# Patient Record
Sex: Male | Born: 1935 | Race: White | Hispanic: No | Marital: Married | Smoking: Never smoker
Health system: Southern US, Community
[De-identification: ages and names within clinical notes are randomized; demographics above are authoritative.]

## PROBLEM LIST (undated history)

## (undated) DIAGNOSIS — E079 Disorder of thyroid, unspecified: Secondary | ICD-10-CM

## (undated) DIAGNOSIS — I4891 Unspecified atrial fibrillation: Secondary | ICD-10-CM

## (undated) DIAGNOSIS — I2510 Atherosclerotic heart disease of native coronary artery without angina pectoris: Secondary | ICD-10-CM

## (undated) DIAGNOSIS — I10 Essential (primary) hypertension: Secondary | ICD-10-CM

## (undated) DIAGNOSIS — G4730 Sleep apnea, unspecified: Secondary | ICD-10-CM

## (undated) HISTORY — PX: PACEMAKER IMPLANT: EP1218

## (undated) HISTORY — PX: HERNIA REPAIR: SHX51

## (undated) HISTORY — PX: BACK SURGERY: SHX140

## (undated) HISTORY — PX: PROSTATE ABLATION: SHX6042

---

## 2010-01-06 ENCOUNTER — Ambulatory Visit: Payer: Self-pay | Admitting: Ophthalmology

## 2010-01-12 ENCOUNTER — Ambulatory Visit: Payer: Self-pay | Admitting: Cardiovascular Disease

## 2010-01-12 ENCOUNTER — Ambulatory Visit: Payer: Self-pay | Admitting: Ophthalmology

## 2011-09-07 ENCOUNTER — Ambulatory Visit: Payer: Self-pay | Admitting: Anesthesiology

## 2011-10-14 ENCOUNTER — Ambulatory Visit: Payer: Self-pay | Admitting: Anesthesiology

## 2016-02-11 DIAGNOSIS — E039 Hypothyroidism, unspecified: Secondary | ICD-10-CM | POA: Diagnosis present

## 2016-03-23 DIAGNOSIS — Z950 Presence of cardiac pacemaker: Secondary | ICD-10-CM | POA: Insufficient documentation

## 2017-09-17 ENCOUNTER — Other Ambulatory Visit: Payer: Self-pay

## 2017-09-17 ENCOUNTER — Emergency Department: Payer: Medicare Other

## 2017-09-17 ENCOUNTER — Emergency Department
Admission: EM | Admit: 2017-09-17 | Discharge: 2017-09-17 | Disposition: A | Discharge status: Home / Self Care | Payer: Medicare Other | Attending: Emergency Medicine | Admitting: Emergency Medicine

## 2017-09-17 DIAGNOSIS — R600 Localized edema: Secondary | ICD-10-CM

## 2017-09-17 DIAGNOSIS — R609 Edema, unspecified: Secondary | ICD-10-CM | POA: Diagnosis not present

## 2017-09-17 DIAGNOSIS — I10 Essential (primary) hypertension: Secondary | ICD-10-CM | POA: Diagnosis not present

## 2017-09-17 DIAGNOSIS — J90 Pleural effusion, not elsewhere classified: Secondary | ICD-10-CM | POA: Diagnosis not present

## 2017-09-17 DIAGNOSIS — Z950 Presence of cardiac pacemaker: Secondary | ICD-10-CM | POA: Diagnosis not present

## 2017-09-17 DIAGNOSIS — I2510 Atherosclerotic heart disease of native coronary artery without angina pectoris: Secondary | ICD-10-CM | POA: Diagnosis not present

## 2017-09-17 DIAGNOSIS — R0602 Shortness of breath: Secondary | ICD-10-CM | POA: Diagnosis present

## 2017-09-17 HISTORY — DX: Atherosclerotic heart disease of native coronary artery without angina pectoris: I25.10

## 2017-09-17 HISTORY — DX: Essential (primary) hypertension: I10

## 2017-09-17 HISTORY — DX: Unspecified atrial fibrillation: I48.91

## 2017-09-17 HISTORY — DX: Sleep apnea, unspecified: G47.30

## 2017-09-17 HISTORY — DX: Disorder of thyroid, unspecified: E07.9

## 2017-09-17 LAB — CBC
HCT: 37.1 % — ABNORMAL LOW (ref 40.0–52.0)
HEMOGLOBIN: 12.6 g/dL — AB (ref 13.0–18.0)
MCH: 36.5 pg — ABNORMAL HIGH (ref 26.0–34.0)
MCHC: 34 g/dL (ref 32.0–36.0)
MCV: 107.4 fL — ABNORMAL HIGH (ref 80.0–100.0)
Platelets: 186 10*3/uL (ref 150–440)
RBC: 3.46 MIL/uL — AB (ref 4.40–5.90)
RDW: 16.6 % — ABNORMAL HIGH (ref 11.5–14.5)
WBC: 4.8 10*3/uL (ref 3.8–10.6)

## 2017-09-17 LAB — COMPREHENSIVE METABOLIC PANEL
ALT: 17 U/L (ref 17–63)
AST: 29 U/L (ref 15–41)
Albumin: 3.7 g/dL (ref 3.5–5.0)
Alkaline Phosphatase: 64 U/L (ref 38–126)
Anion gap: 7 (ref 5–15)
BUN: 24 mg/dL — AB (ref 6–20)
CHLORIDE: 102 mmol/L (ref 101–111)
CO2: 27 mmol/L (ref 22–32)
CREATININE: 1.38 mg/dL — AB (ref 0.61–1.24)
Calcium: 8.8 mg/dL — ABNORMAL LOW (ref 8.9–10.3)
GFR calc Af Amer: 53 mL/min — ABNORMAL LOW (ref >60)
GFR calc non Af Amer: 46 mL/min — ABNORMAL LOW (ref >60)
GLUCOSE: 119 mg/dL — AB (ref 65–99)
Potassium: 4.1 mmol/L (ref 3.5–5.1)
SODIUM: 136 mmol/L (ref 135–145)
Total Bilirubin: 1.5 mg/dL — ABNORMAL HIGH (ref 0.3–1.2)
Total Protein: 6.4 g/dL — ABNORMAL LOW (ref 6.5–8.1)

## 2017-09-17 LAB — TROPONIN I
TROPONIN I: 0.05 ng/mL — AB (ref <0.03)
TROPONIN I: 0.05 ng/mL — AB (ref <0.03)

## 2017-09-17 LAB — BRAIN NATRIURETIC PEPTIDE: B Natriuretic Peptide: 597 pg/mL — ABNORMAL HIGH (ref 0.0–100.0)

## 2017-09-17 MED ORDER — IPRATROPIUM-ALBUTEROL 0.5-2.5 (3) MG/3ML IN SOLN
3.0000 mL | Freq: Once | RESPIRATORY_TRACT | Status: AC
  Administered 2017-09-17: 3 mL via RESPIRATORY_TRACT
  Filled 2017-09-17: qty 3

## 2017-09-17 MED ORDER — FUROSEMIDE 10 MG/ML IJ SOLN
20.0000 mg | Freq: Once | INTRAMUSCULAR | Status: AC
  Administered 2017-09-17: 20 mg via INTRAVENOUS
  Filled 2017-09-17: qty 4

## 2017-09-17 NOTE — Discharge Instructions (Signed)
Please seek medical attention for any high fevers, chest pain, shortness of breath, change in behavior, persistent vomiting, bloody stool or any other new or concerning symptoms.  

## 2017-09-17 NOTE — ED Triage Notes (Signed)
Pt c/o increased SOB for the past 3 days with HA and nausea today. Pt is slightly tachypneic in triage.

## 2017-09-17 NOTE — ED Provider Notes (Signed)
[facility] Emergency Department Provider Note   ____________________________________________   I have reviewed the triage vital signs and the nursing notes.   HISTORY  Chief Complaint Shortness of Breath   History limited by: Not Limited   HPI Jacob Glenn is a 83 y.o. male who presents to the emergency department today because of shortness of breath  DURATION:roughly 1 week TIMING: gradually worsening CONTEXT: patient states he has history of COPD.  MODIFYING FACTORS: worse with lying flat ASSOCIATED SYMPTOMS: denies any chest pain. Denies any fever. Has some nausea.   Per medical record review patient has a history of Afib, CAD.   Past Medical History:  Diagnosis Date  . A-fib ([place])   . Coronary artery disease   . Hypertension   . Sleep apnea   . Thyroid disease     There are no active problems to display for this patient.   Past Surgical History:  Procedure Laterality Date  . BACK SURGERY    . HERNIA REPAIR    . PACEMAKER IMPLANT    . PROSTATE ABLATION      Prior to Admission medications   Not on File    Allergies Patient has no known allergies.  No family history on file.  Social History Social History   Tobacco Use  . Smoking status: Never Smoker  . Smokeless tobacco: Never Used  Substance Use Topics  . Alcohol use: No    Frequency: Never  . Drug use: No    Review of Systems Constitutional: No fever/chills Eyes: No visual changes. ENT: No sore throat. Cardiovascular: Denies chest pain. Respiratory: Positive for shortness of breath. Gastrointestinal: No abdominal pain.  No nausea, no vomiting.  No diarrhea.   Genitourinary: Negative for dysuria. Musculoskeletal: Negative for back pain. Skin: Negative for rash. Neurological: Positive for headache.   ____________________________________________   PHYSICAL EXAM:  VITAL SIGNS: ED Triage Vitals [09/17/17 1453]  Enc Vitals Group     BP (!) 153/71   Pulse Rate 70     Resp (!) 21     Temp      Temp Source Oral     SpO2 100 %     Weight 180 lb (81.6 kg)     Height 6' (1.829 m)     Head Circumference      Peak Flow      Pain Score 5   Constitutional: Alert and oriented. Well appearing and in no distress. Eyes: Conjunctivae are normal.  ENT   Head: Normocephalic and atraumatic.   Nose: No congestion/rhinnorhea.   Mouth/Throat: Mucous membranes are moist.   Neck: No stridor. Hematological/Lymphatic/Immunilogical: No cervical lymphadenopathy. Cardiovascular: Normal rate, regular rhythm.  No murmurs, rubs, or gallops. Respiratory: Normal respiratory effort without tachypnea nor retractions. Slight wheeze in bases.  Gastrointestinal: Soft and non tender. No rebound. No guarding.  Genitourinary: Deferred Musculoskeletal: Normal range of motion in all extremities. Trace bilateral pitting edema.  Neurologic:  Normal speech and language. No gross focal neurologic deficits are appreciated.  Skin:  Skin is warm, dry and intact. No rash noted. Psychiatric: Mood and affect are normal. Speech and behavior are normal. Patient exhibits appropriate insight and judgment.  ____________________________________________    LABS (pertinent positives/negatives)  Trop 0.05 x 2 BNP 597.0 CBC wbc 4.8, hgb 12.6 CMP cr 1.38, k 4.1  ____________________________________________   EKG  I, Nance Pear, attending physician, personally viewed and interpreted this EKG  EKG Time: 1455 Rate: 70 Rhythm: ventricular paced rhythm Axis: left  axis deviation Intervals: qtc 488 QRS: wide ST changes: no st elevation equivalent Impression: abnormal ekg   ____________________________________________    RADIOLOGY  CXR Small pleural effusions with infiltrate.  ____________________________________________   PROCEDURES  Procedures  ____________________________________________   INITIAL IMPRESSION / ASSESSMENT AND PLAN / ED  COURSE  Pertinent labs & imaging results that were available during my care of the patient were reviewed by me and considered in my medical decision making (see chart for details).  Differential includes, but is not limited to, viral syndrome, bronchitis including COPD exacerbation, pneumonia, reactive airway disease including asthma, CHF including exacerbation with or without pulmonary/interstitial edema, pneumothorax, ACS, thoracic trauma, and pulmonary embolism. Patient did have some peripheral edema on exam. BNP elevated. Do think likely sob secondary to fluid overload. Patient had been on lasix on the past but currently not taking any. Troponin was initially elevated but repeat does not show any change. Doubt ACS, think elevation more likely secondary to fluid overload. No chest pain. Will give patient dose of lasix here in the emergency department. Discussed findings with patient. Discussed importance of primary care follow up.  ____________________________________________   FINAL CLINICAL IMPRESSION(S) / ED DIAGNOSES  Final diagnoses:  SOB (shortness of breath)  Pleural effusion  Peripheral edema     Note: This dictation was prepared with Dragon dictation. Any transcriptional errors that result from this process are unintentional     Nance Pear, MD 09/17/17 [phone]

## 2017-09-17 NOTE — ED Notes (Signed)
Date and time results received: 09/17/17 4:53 PM  Test: troponin I Critical Value: 0.05  Name of Provider Notified: Dr. Archie Balboa  Orders Received? Or Actions Taken?: no new orders at this time

## 2017-09-17 NOTE — ED Notes (Signed)

## 2019-03-29 DIAGNOSIS — Z66 Do not resuscitate: Secondary | ICD-10-CM | POA: Insufficient documentation

## 2019-04-08 ENCOUNTER — Emergency Department: Payer: Medicare Other

## 2019-04-08 ENCOUNTER — Other Ambulatory Visit: Payer: Self-pay

## 2019-04-08 ENCOUNTER — Emergency Department
Admission: EM | Admit: 2019-04-08 | Discharge: 2019-04-08 | Disposition: A | Discharge status: Home / Self Care | Payer: Medicare Other | Attending: Emergency Medicine | Admitting: Emergency Medicine

## 2019-04-08 ENCOUNTER — Encounter: Payer: Self-pay | Admitting: Emergency Medicine

## 2019-04-08 DIAGNOSIS — I2510 Atherosclerotic heart disease of native coronary artery without angina pectoris: Secondary | ICD-10-CM | POA: Diagnosis not present

## 2019-04-08 DIAGNOSIS — Z79899 Other long term (current) drug therapy: Secondary | ICD-10-CM | POA: Diagnosis not present

## 2019-04-08 DIAGNOSIS — Z7901 Long term (current) use of anticoagulants: Secondary | ICD-10-CM | POA: Insufficient documentation

## 2019-04-08 DIAGNOSIS — Z950 Presence of cardiac pacemaker: Secondary | ICD-10-CM | POA: Insufficient documentation

## 2019-04-08 DIAGNOSIS — N189 Chronic kidney disease, unspecified: Secondary | ICD-10-CM | POA: Diagnosis not present

## 2019-04-08 DIAGNOSIS — R0789 Other chest pain: Secondary | ICD-10-CM | POA: Insufficient documentation

## 2019-04-08 DIAGNOSIS — I129 Hypertensive chronic kidney disease with stage 1 through stage 4 chronic kidney disease, or unspecified chronic kidney disease: Secondary | ICD-10-CM | POA: Insufficient documentation

## 2019-04-08 DIAGNOSIS — R079 Chest pain, unspecified: Secondary | ICD-10-CM | POA: Diagnosis present

## 2019-04-08 LAB — CBC
HCT: 37.8 % — ABNORMAL LOW (ref 39.0–52.0)
Hemoglobin: 12.7 g/dL — ABNORMAL LOW (ref 13.0–17.0)
MCH: 35.3 pg — ABNORMAL HIGH (ref 26.0–34.0)
MCHC: 33.6 g/dL (ref 30.0–36.0)
MCV: 105 fL — ABNORMAL HIGH (ref 80.0–100.0)
Platelets: 170 10*3/uL (ref 150–400)
RBC: 3.6 MIL/uL — ABNORMAL LOW (ref 4.22–5.81)
RDW: 14.7 % (ref 11.5–15.5)
WBC: 7.8 10*3/uL (ref 4.0–10.5)
nRBC: 0 % (ref 0.0–0.2)

## 2019-04-08 LAB — BASIC METABOLIC PANEL
Anion gap: 8 (ref 5–15)
BUN: 34 mg/dL — ABNORMAL HIGH (ref 8–23)
CO2: 28 mmol/L (ref 22–32)
Calcium: 8.5 mg/dL — ABNORMAL LOW (ref 8.9–10.3)
Chloride: 102 mmol/L (ref 98–111)
Creatinine, Ser: 2.05 mg/dL — ABNORMAL HIGH (ref 0.61–1.24)
GFR calc Af Amer: 33 mL/min — ABNORMAL LOW (ref >60)
GFR calc non Af Amer: 29 mL/min — ABNORMAL LOW (ref >60)
Glucose, Bld: 110 mg/dL — ABNORMAL HIGH (ref 70–99)
Potassium: 4 mmol/L (ref 3.5–5.1)
Sodium: 138 mmol/L (ref 135–145)

## 2019-04-08 LAB — TROPONIN I
Troponin I: 0.05 ng/mL (ref <0.03)
Troponin I: 0.05 ng/mL (ref <0.03)

## 2019-04-08 MED ORDER — ASPIRIN 81 MG PO CHEW
162.0000 mg | CHEWABLE_TABLET | Freq: Once | ORAL | Status: AC
  Administered 2019-04-08: 11:00:00 162 mg via ORAL
  Filled 2019-04-08: qty 2

## 2019-04-08 MED ORDER — SODIUM CHLORIDE 0.9% FLUSH
3.0000 mL | Freq: Once | INTRAVENOUS | Status: AC
  Administered 2019-04-08: 3 mL via INTRAVENOUS

## 2019-04-08 NOTE — Discharge Instructions (Addendum)
Your kidney function has worsened somewhat, with a creatinine level of 2 (previously it was 1.6).  You should follow up with your regular doctor within the next 1-2 weeks to have this rechecked.  Return to the ER immediately for new, worsening, or persistent chest pain, difficulty breathing, weakness or lightheadedness, or any other new or worsening symptoms that concern you.

## 2019-04-08 NOTE — ED Provider Notes (Signed)
[facility] Emergency Department Provider Note ____________________________________________   First MD Initiated Contact with Patient 04/08/19 1043     (approximate)  I have reviewed the triage vital signs and the nursing notes.   HISTORY  Chief Complaint Chest Pain    HPI Jacob Glenn is a 85 y.o. male with PMH as noted below including atrial fibrillation on Eliquis and CAD who presents with chest pain, acute onset around 3 AM and described as a severe substernal pain which did not radiate.  The patient states that it was worse with deep inspiration.  It lasted for several hours and then gradually abated.  He states he just feels a mild discomfort when he takes a deep breath now.  He states during the intense pain he felt somewhat short of breath although he does not feel this way anymore.  He denies nausea or vomiting and has no dizziness.  He denies any cough or fever and states he was feeling fine until early this morning.  Past Medical History:  Diagnosis Date  . A-fib ([place])   . Coronary artery disease   . Hypertension   . Sleep apnea   . Thyroid disease     There are no active problems to display for this patient.   Past Surgical History:  Procedure Laterality Date  . BACK SURGERY    . HERNIA REPAIR    . PACEMAKER IMPLANT    . PROSTATE ABLATION      Prior to Admission medications   Medication Sig Start Date End Date Taking? Authorizing Provider  apixaban (ELIQUIS) 2.5 MG TABS tablet Take 2.5 mg by mouth 2 (two) times a day. 10/17/18  Yes [provider]  carvedilol (COREG) 3.125 MG tablet Take 3.125 mg by mouth 2 (two) times a day. 10/17/18  Yes [provider]  furosemide (LASIX) 40 MG tablet Take 40 mg by mouth daily. 11/20/18  Yes [provider]  levothyroxine (SYNTHROID) 100 MCG tablet Take 100 mcg by mouth daily. 07/03/18  Yes [provider]  losartan (COZAAR) 25 MG tablet Take 25 mg by mouth daily.  11/20/18 11/20/19 Yes [provider]    Allergies Patient has no known allergies.  No family history on file.  Social History Social History   Tobacco Use  . Smoking status: Never Smoker  . Smokeless tobacco: Never Used  Substance Use Topics  . Alcohol use: No    Frequency: Never  . Drug use: No    Review of Systems  Constitutional: No fever. Eyes: No redness. ENT: No neck pain. Cardiovascular: Positive for resolved chest pain. Respiratory: Positive for resolved shortness of breath. Gastrointestinal: No vomiting or diarrhea.  Genitourinary: Negative for flank pain.  Musculoskeletal: Negative for back pain.  Negative for leg swelling. Skin: Negative for rash. Neurological: Negative for headache.   ____________________________________________   PHYSICAL EXAM:  VITAL SIGNS: ED Triage Vitals  Enc Vitals Group     BP 04/08/19 1043 114/79     Pulse Rate 04/08/19 1043 70     Resp 04/08/19 1043 20     Temp 04/08/19 1043 98.5 F (36.9 C)     Temp Source 04/08/19 1043 Oral     SpO2 04/08/19 1043 99 %     Weight 04/08/19 1044 180 lb (81.6 kg)     Height 04/08/19 1044 6' (1.829 m)     Head Circumference --      Peak Flow --      Pain Score  04/08/19 1044 0     Pain Loc --      Pain Edu? --      Excl. in [place]? --     Constitutional: Alert and oriented. Well appearing for age and in no acute distress. Eyes: Conjunctivae are normal.  Head: Atraumatic. Nose: No congestion/rhinnorhea. Mouth/Throat: Mucous membranes are moist.   Neck: Normal range of motion.  Cardiovascular: Normal rate, regular rhythm. Grossly normal heart sounds.  Good peripheral circulation. Respiratory: Normal respiratory effort.  No retractions. Lungs CTAB. Gastrointestinal: No distention.  Musculoskeletal: No lower extremity edema.  No calf or popliteal swelling or tenderness.  Extremities warm and well perfused.  Neurologic:  Normal speech and language. No gross focal neurologic deficits  are appreciated.  Skin:  Skin is warm and dry. No rash noted. Psychiatric: Mood and affect are normal. Speech and behavior are normal.  ____________________________________________   LABS (all labs ordered are listed, but only abnormal results are displayed)  Labs Reviewed  BASIC METABOLIC PANEL - Abnormal; Notable for the following components:      Result Value   Glucose, Bld 110 (*)    BUN 34 (*)    Creatinine, Ser 2.05 (*)    Calcium 8.5 (*)    GFR calc non Af Amer 29 (*)    GFR calc Af Amer 33 (*)    All other components within normal limits  CBC - Abnormal; Notable for the following components:   RBC 3.60 (*)    Hemoglobin 12.7 (*)    HCT 37.8 (*)    MCV 105.0 (*)    MCH 35.3 (*)    All other components within normal limits  TROPONIN I - Abnormal; Notable for the following components:   Troponin I 0.05 (*)    All other components within normal limits  TROPONIN I - Abnormal; Notable for the following components:   Troponin I 0.05 (*)    All other components within normal limits   ____________________________________________  EKG  ED ECG REPORT I, Arta Silence, the attending physician, personally viewed and interpreted this ECG.  Date: 04/08/2019 EKG Time: 1039 Rate: 70 Rhythm: Paced rhythm QRS Axis: normal Intervals: LBBB morphology ST/T Wave abnormalities: normal Narrative Interpretation: no evidence of acute ischemia  ____________________________________________  RADIOLOGY  CXR: No focal infiltrate or other acute abnormality  ____________________________________________   PROCEDURES  Procedure(s) performed: No  Procedures  Critical Care performed: No ____________________________________________   INITIAL IMPRESSION / ASSESSMENT AND PLAN / ED COURSE  Pertinent labs & imaging results that were available during my care of the patient were reviewed by me and considered in my medical decision making (see chart for details).  85 year old  male with a history of atrial fibrillation, hypertension, pacemaker, and CAD presents with chest pain starting around 4 AM and now almost completely resolved.  EMS sent over an EKG which showed approximately 5 mm of discordant ST elevation in lead V4 only, and reported that the patient had no active chest pain so I did not activate STEMI code.  On exam the patient is relatively well-appearing for his age.  His vital signs are normal.  The physical exam is unremarkable.  EKG here shows paced rhythm with LBBB morphology and I no longer see the discordant elevation in V4.  The current EKG appears identical to the patient's most recent EKG in November 2018.  Overall I have a low suspicion for cardiac etiology on this presentation although the patient's risk is somewhat elevated given his history.  Although the patient reports that the pain was somewhat pleuritic, given that it resolved spontaneously and since he has no leg swelling or other symptoms of DVT and no tachycardia or hypoxia, there is no clinical evidence to suggest PE.  Also the resolved pain and reassuring vital signs make aortic dissection or other vascular etiology extremely unlikely.  We will obtain basic labs, chest x-ray, cardiac enzymes x2 and reassess.  Anticipate if the patient remains pain-free and has negative enzymes he may be able to go home.  ----------------------------------------- 3:11 PM on 04/08/2019 -----------------------------------------  The patient has remained asymptomatic throughout his ED stay, now more than 4 hours.  Initial troponin was minimally elevated although this appears to be the patient's baseline, and repeat after 3 hours was unchanged.  There is no evidence for ACS.  The lab work-up is significant for elevated creatinine.  The patient's recent baseline from earlier this year appears to be around 1.6, and now it has slightly worsened to 2.  I suspect most likely prerenal etiology.  However, given that  the patient is asymptomatic and has normal urine output,, he does not require admission for emergent work-up.  The patient himself feels well and very much wants to go home.  I advised him that he was at increased risk for ACS given his history and we could observe him if he wanted, but he wants to be discharged.  I instructed him to make an appointment to follow-up with his primary care doctor to recheck and monitor the kidney function.  I gave the patient the return precautions, and he expresses understanding.  ______________________________________  Jacob Glenn was evaluated in Emergency Department on 04/08/2019 for the symptoms described in the history of present illness. He was evaluated in the context of the global COVID-19 pandemic, which necessitated consideration that the patient might be at risk for infection with the SARS-CoV-2 virus that causes COVID-19. Institutional protocols and algorithms that pertain to the evaluation of patients at risk for COVID-19 are in a state of rapid change based on information released by regulatory bodies including the CDC and federal and state organizations. These policies and algorithms were followed during the patient's care in the ED. ____________________________________________   FINAL CLINICAL IMPRESSION(S) / ED DIAGNOSES  Final diagnoses:  Atypical chest pain  Acute on chronic renal insufficiency      NEW MEDICATIONS STARTED DURING THIS VISIT:  New Prescriptions   No medications on file     Note:  This document was prepared using Dragon voice recognition software and may include unintentional dictation errors.    Arta Silence, MD 04/08/19 [phone]

## 2019-04-08 NOTE — ED Triage Notes (Signed)
States felt tired last evening. States about 2 am woke. States now has chest pain when he takes.

## 2019-05-21 ENCOUNTER — Other Ambulatory Visit: Payer: Self-pay | Admitting: Physician Assistant

## 2019-05-21 ENCOUNTER — Ambulatory Visit
Admission: RE | Admit: 2019-05-21 | Discharge: 2019-05-21 | Disposition: A | Discharge status: Home / Self Care | Payer: Medicare Other | Source: Ambulatory Visit | Attending: Physician Assistant | Admitting: Physician Assistant

## 2019-05-21 ENCOUNTER — Other Ambulatory Visit: Payer: Self-pay

## 2019-05-21 DIAGNOSIS — R42 Dizziness and giddiness: Secondary | ICD-10-CM | POA: Insufficient documentation

## 2019-05-21 DIAGNOSIS — H532 Diplopia: Secondary | ICD-10-CM

## 2019-05-27 ENCOUNTER — Ambulatory Visit
Admission: RE | Admit: 2019-05-27 | Discharge: 2019-05-27 | Disposition: A | Discharge status: Home / Self Care | Payer: Medicare Other | Source: Ambulatory Visit | Attending: Physician Assistant | Admitting: Physician Assistant

## 2019-05-27 ENCOUNTER — Other Ambulatory Visit: Payer: Self-pay

## 2019-05-27 DIAGNOSIS — H532 Diplopia: Secondary | ICD-10-CM

## 2019-05-27 DIAGNOSIS — R42 Dizziness and giddiness: Secondary | ICD-10-CM | POA: Diagnosis present

## 2019-07-29 ENCOUNTER — Encounter: Payer: Self-pay | Admitting: Emergency Medicine

## 2019-07-29 ENCOUNTER — Other Ambulatory Visit: Payer: Self-pay

## 2019-07-29 ENCOUNTER — Inpatient Hospital Stay
Admission: EM | Admit: 2019-07-29 | Discharge: 2019-08-01 | DRG: 871 | Disposition: A | Discharge status: Home / Self Care | Payer: Medicare Other | Attending: Internal Medicine | Admitting: Internal Medicine

## 2019-07-29 ENCOUNTER — Emergency Department: Payer: Medicare Other

## 2019-07-29 ENCOUNTER — Inpatient Hospital Stay: Payer: Medicare Other

## 2019-07-29 DIAGNOSIS — Z20828 Contact with and (suspected) exposure to other viral communicable diseases: Secondary | ICD-10-CM | POA: Diagnosis present

## 2019-07-29 DIAGNOSIS — I480 Paroxysmal atrial fibrillation: Secondary | ICD-10-CM | POA: Diagnosis present

## 2019-07-29 DIAGNOSIS — G4730 Sleep apnea, unspecified: Secondary | ICD-10-CM | POA: Diagnosis present

## 2019-07-29 DIAGNOSIS — G9341 Metabolic encephalopathy: Secondary | ICD-10-CM | POA: Diagnosis present

## 2019-07-29 DIAGNOSIS — R197 Diarrhea, unspecified: Secondary | ICD-10-CM | POA: Diagnosis present

## 2019-07-29 DIAGNOSIS — A4151 Sepsis due to Escherichia coli [E. coli]: Principal | ICD-10-CM | POA: Diagnosis present

## 2019-07-29 DIAGNOSIS — Z8249 Family history of ischemic heart disease and other diseases of the circulatory system: Secondary | ICD-10-CM

## 2019-07-29 DIAGNOSIS — Z7901 Long term (current) use of anticoagulants: Secondary | ICD-10-CM

## 2019-07-29 DIAGNOSIS — I129 Hypertensive chronic kidney disease with stage 1 through stage 4 chronic kidney disease, or unspecified chronic kidney disease: Secondary | ICD-10-CM | POA: Diagnosis present

## 2019-07-29 DIAGNOSIS — N390 Urinary tract infection, site not specified: Secondary | ICD-10-CM

## 2019-07-29 DIAGNOSIS — A419 Sepsis, unspecified organism: Secondary | ICD-10-CM

## 2019-07-29 DIAGNOSIS — Z79890 Hormone replacement therapy (postmenopausal): Secondary | ICD-10-CM | POA: Diagnosis not present

## 2019-07-29 DIAGNOSIS — R509 Fever, unspecified: Secondary | ICD-10-CM

## 2019-07-29 DIAGNOSIS — N179 Acute kidney failure, unspecified: Secondary | ICD-10-CM | POA: Diagnosis present

## 2019-07-29 DIAGNOSIS — R112 Nausea with vomiting, unspecified: Secondary | ICD-10-CM

## 2019-07-29 DIAGNOSIS — N183 Chronic kidney disease, stage 3 unspecified: Secondary | ICD-10-CM | POA: Diagnosis present

## 2019-07-29 DIAGNOSIS — I2510 Atherosclerotic heart disease of native coronary artery without angina pectoris: Secondary | ICD-10-CM | POA: Diagnosis present

## 2019-07-29 DIAGNOSIS — D631 Anemia in chronic kidney disease: Secondary | ICD-10-CM | POA: Diagnosis present

## 2019-07-29 DIAGNOSIS — Z66 Do not resuscitate: Secondary | ICD-10-CM | POA: Diagnosis present

## 2019-07-29 DIAGNOSIS — Z950 Presence of cardiac pacemaker: Secondary | ICD-10-CM

## 2019-07-29 LAB — URINALYSIS, COMPLETE (UACMP) WITH MICROSCOPIC
Bacteria, UA: NONE SEEN
Bilirubin Urine: NEGATIVE
Glucose, UA: NEGATIVE mg/dL
Ketones, ur: NEGATIVE mg/dL
Nitrite: NEGATIVE
Protein, ur: 30 mg/dL — AB
Specific Gravity, Urine: 1.009 (ref 1.005–1.030)
WBC, UA: >50 WBC/hpf — ABNORMAL HIGH (ref 0–5)
pH: 5 (ref 5.0–8.0)

## 2019-07-29 LAB — CBC
HCT: 35.9 % — ABNORMAL LOW (ref 39.0–52.0)
Hemoglobin: 12.2 g/dL — ABNORMAL LOW (ref 13.0–17.0)
MCH: 36.2 pg — ABNORMAL HIGH (ref 26.0–34.0)
MCHC: 34 g/dL (ref 30.0–36.0)
MCV: 106.5 fL — ABNORMAL HIGH (ref 80.0–100.0)
Platelets: 170 10*3/uL (ref 150–400)
RBC: 3.37 MIL/uL — ABNORMAL LOW (ref 4.22–5.81)
RDW: 14.7 % (ref 11.5–15.5)
WBC: 8.4 10*3/uL (ref 4.0–10.5)
nRBC: 0 % (ref 0.0–0.2)

## 2019-07-29 LAB — LACTIC ACID, PLASMA: Lactic Acid, Venous: 1.2 mmol/L (ref 0.5–1.9)

## 2019-07-29 LAB — COMPREHENSIVE METABOLIC PANEL
ALT: 19 U/L (ref 0–44)
AST: 29 U/L (ref 15–41)
Albumin: 3.8 g/dL (ref 3.5–5.0)
Alkaline Phosphatase: 68 U/L (ref 38–126)
Anion gap: 11 (ref 5–15)
BUN: 32 mg/dL — ABNORMAL HIGH (ref 8–23)
CO2: 27 mmol/L (ref 22–32)
Calcium: 8.9 mg/dL (ref 8.9–10.3)
Chloride: 96 mmol/L — ABNORMAL LOW (ref 98–111)
Creatinine, Ser: 2.07 mg/dL — ABNORMAL HIGH (ref 0.61–1.24)
GFR calc Af Amer: 33 mL/min — ABNORMAL LOW (ref >60)
GFR calc non Af Amer: 28 mL/min — ABNORMAL LOW (ref >60)
Glucose, Bld: 149 mg/dL — ABNORMAL HIGH (ref 70–99)
Potassium: 4.2 mmol/L (ref 3.5–5.1)
Sodium: 134 mmol/L — ABNORMAL LOW (ref 135–145)
Total Bilirubin: 2.4 mg/dL — ABNORMAL HIGH (ref 0.3–1.2)
Total Protein: 7 g/dL (ref 6.5–8.1)

## 2019-07-29 LAB — PROTIME-INR
INR: 1.5 — ABNORMAL HIGH (ref 0.8–1.2)
Prothrombin Time: 17.6 seconds — ABNORMAL HIGH (ref 11.4–15.2)

## 2019-07-29 LAB — PROCALCITONIN: Procalcitonin: 0.13 ng/mL

## 2019-07-29 LAB — SARS CORONAVIRUS 2 BY RT PCR (HOSPITAL ORDER, PERFORMED IN ~~LOC~~ HOSPITAL LAB): SARS Coronavirus 2: NEGATIVE

## 2019-07-29 LAB — LIPASE, BLOOD: Lipase: 21 U/L (ref 11–51)

## 2019-07-29 LAB — GLUCOSE, CAPILLARY: Glucose-Capillary: 115 mg/dL — ABNORMAL HIGH (ref 70–99)

## 2019-07-29 LAB — APTT: aPTT: 29 seconds (ref 24–36)

## 2019-07-29 MED ORDER — VANCOMYCIN HCL IN DEXTROSE 1-5 GM/200ML-% IV SOLN: 1000.0000 mg | Freq: Once | INTRAVENOUS | Status: DC

## 2019-07-29 MED ORDER — SODIUM CHLORIDE 0.9 % IV SOLN
2.0000 g | Freq: Once | INTRAVENOUS | Status: AC
  Administered 2019-07-29: 15:00:00 2 g via INTRAVENOUS
  Filled 2019-07-29: qty 2

## 2019-07-29 MED ORDER — APIXABAN 2.5 MG PO TABS
2.5000 mg | ORAL_TABLET | Freq: Two times a day (BID) | ORAL | Status: DC
  Administered 2019-07-29 – 2019-08-01 (×6): 2.5 mg via ORAL
  Filled 2019-07-29 (×6): qty 1

## 2019-07-29 MED ORDER — ACETAMINOPHEN 325 MG PO TABS
650.0000 mg | ORAL_TABLET | Freq: Once | ORAL | Status: AC | PRN
  Administered 2019-07-29: 650 mg via ORAL
  Filled 2019-07-29: qty 2

## 2019-07-29 MED ORDER — VANCOMYCIN HCL 1.5 G IV SOLR
1500.0000 mg | Freq: Once | INTRAVENOUS | Status: DC
  Filled 2019-07-29: qty 1500

## 2019-07-29 MED ORDER — SODIUM CHLORIDE 0.9 % IV SOLN
1.0000 g | INTRAVENOUS | Status: DC
  Administered 2019-07-30: 01:00:00 1 g via INTRAVENOUS
  Filled 2019-07-29: qty 1

## 2019-07-29 MED ORDER — METRONIDAZOLE IN NACL 5-0.79 MG/ML-% IV SOLN
500.0000 mg | Freq: Once | INTRAVENOUS | Status: AC
  Administered 2019-07-29: 15:00:00 500 mg via INTRAVENOUS
  Filled 2019-07-29: qty 100

## 2019-07-29 MED ORDER — LEVOTHYROXINE SODIUM 100 MCG PO TABS
100.0000 ug | ORAL_TABLET | Freq: Every day | ORAL | Status: DC
  Administered 2019-07-30 – 2019-08-01 (×3): 100 ug via ORAL
  Filled 2019-07-29 (×4): qty 1

## 2019-07-29 MED ORDER — DOXAZOSIN MESYLATE 4 MG PO TABS
4.0000 mg | ORAL_TABLET | Freq: Every day | ORAL | Status: DC
  Filled 2019-07-29: qty 1

## 2019-07-29 MED ORDER — SODIUM CHLORIDE 0.9 % IV BOLUS
1000.0000 mL | Freq: Once | INTRAVENOUS | Status: AC
  Administered 2019-07-29: 17:00:00 1000 mL via INTRAVENOUS

## 2019-07-29 MED ORDER — DOXAZOSIN MESYLATE 4 MG PO TABS
4.0000 mg | ORAL_TABLET | Freq: Every day | ORAL | Status: DC
  Administered 2019-07-30 – 2019-08-01 (×3): 4 mg via ORAL
  Filled 2019-07-29 (×3): qty 1

## 2019-07-29 MED ORDER — SODIUM CHLORIDE 0.9 % IV SOLN
INTRAVENOUS | Status: DC
  Administered 2019-07-29 – 2019-07-31 (×2): via INTRAVENOUS

## 2019-07-29 MED ORDER — ACETAMINOPHEN 325 MG PO TABS
650.0000 mg | ORAL_TABLET | Freq: Four times a day (QID) | ORAL | Status: DC | PRN
  Administered 2019-07-29 – 2019-08-01 (×5): 650 mg via ORAL
  Filled 2019-07-29 (×5): qty 2

## 2019-07-29 MED ORDER — ONDANSETRON HCL 4 MG/2ML IJ SOLN
4.0000 mg | Freq: Once | INTRAMUSCULAR | Status: AC | PRN
  Administered 2019-07-29: 14:00:00 4 mg via INTRAVENOUS
  Filled 2019-07-29: qty 2

## 2019-07-29 MED ORDER — ONDANSETRON HCL 4 MG/2ML IJ SOLN: 4.0000 mg | Freq: Four times a day (QID) | INTRAMUSCULAR | Status: DC | PRN

## 2019-07-29 MED ORDER — SODIUM CHLORIDE 0.9 % IV BOLUS
1000.0000 mL | Freq: Once | INTRAVENOUS | Status: AC
  Administered 2019-07-29: 14:00:00 1000 mL via INTRAVENOUS

## 2019-07-29 MED ORDER — DOCUSATE SODIUM 100 MG PO CAPS: 100.0000 mg | ORAL_CAPSULE | Freq: Two times a day (BID) | ORAL | Status: DC | PRN

## 2019-07-29 NOTE — Progress Notes (Signed)
Family Meeting Note  Advance Directive:yes  Today a meeting took place with the Patient and spouse.  The following clinical team members were present during this meeting:MD  The following were discussed:Patient's diagnosis: Sepsis, UTI, hypertension, A. fib, chronic kidney disease, Patient's progosis: Unable to determine and Goals for treatment: DNR  Additional follow-up to be provided: PMD  Time spent during discussion:20 minutes  Vaughan Basta, MD

## 2019-07-29 NOTE — Consult Note (Signed)
PHARMACY -  BRIEF ANTIBIOTIC NOTE   Pharmacy has received consult(s) for  from an ED provider.  The patient's profile has been reviewed for Cefepime/Vancomycin ht/wt/allergies/indication/available labs.    One time order(s) placed for Vancomycin 1500mg  IV x 1 and Cefepime 2g x 1  Further antibiotics/pharmacy consults should be ordered by admitting physician if indicated.                       Thank you,  Lu Duffel, PharmD, BCPS Clinical Pharmacist 07/29/2019 2:23 PM

## 2019-07-29 NOTE — ED Triage Notes (Signed)
Pt here with c/o mid abdomen pain that began yesterday with vomiting, some diarrhea and fever for 2 days as well.  103.1 in triage. Has had sips of soda today but has not eaten anything, wife states he is very weak. Pt answers questions appropriately.

## 2019-07-29 NOTE — ED Provider Notes (Addendum)
[facility] Emergency Department Provider Note  ____________________________________________   First MD Initiated Contact with Patient 07/29/19 1407     (approximate)  I have reviewed the triage vital signs and the nursing notes.  History  Chief Complaint Abdominal Pain, Fever, and Emesis    HPI Jacob Glenn is a 85 y.o. male with a history of CAD, atrial fibrillation, status post pacemaker who presents emergency department for fevers, nausea, vomiting, diarrhea, malodorous urine.  Wife at bedside states that the patient started vomiting yesterday, and has had at least 4 nonbloody, nonbilious episodes.  He also reports at least 3 episodes of loose stool.  He developed fever today.  He reports some lower abdominal pain during the same time period, though none currently.  He does report some recently malodorous urine.  No cough or difficulty breathing.  No sick contacts. Wife states that he seems a little bit more confused than normal as well.    Past Medical Hx Past Medical History:  Diagnosis Date  . A-fib ([place])   . Coronary artery disease   . Hypertension   . Sleep apnea   . Thyroid disease     Problem List There are no active problems to display for this patient.   Past Surgical Hx Past Surgical History:  Procedure Laterality Date  . BACK SURGERY    . HERNIA REPAIR    . PACEMAKER IMPLANT    . PROSTATE ABLATION      Medications Prior to Admission medications   Medication Sig Start Date End Date Taking? Authorizing Provider  apixaban (ELIQUIS) 2.5 MG TABS tablet Take 2.5 mg by mouth 2 (two) times a day. 10/17/18   [provider]  carvedilol (COREG) 3.125 MG tablet Take 3.125 mg by mouth 2 (two) times a day. 10/17/18   [provider]  furosemide (LASIX) 40 MG tablet Take 40 mg by mouth daily. 11/20/18   [provider]  levothyroxine (SYNTHROID) 100 MCG tablet Take 100 mcg by mouth daily. 07/03/18   [provider]  losartan (COZAAR) 25 MG tablet Take 25 mg by mouth daily. 11/20/18 11/20/19  [provider]    Allergies Patient has no known allergies.  Family Hx No family history on file.  Social Hx Social History   Tobacco Use  . Smoking status: Never Smoker  . Smokeless tobacco: Never Used  Substance Use Topics  . Alcohol use: No    Frequency: Never  . Drug use: No     Review of Systems  Constitutional: + fever Eyes: Negative for visual changes. ENT: Negative for sore throat. Cardiovascular: Negative for chest pain. Respiratory: Negative for shortness of breath. Gastrointestinal: + for nausea, vomiting, loose stool.  Genitourinary: Negative for dysuria. + malodorous urine Musculoskeletal: Negative for leg swelling. Skin: Negative for rash. Neurological: Negative for for headaches.   Physical Exam  Vital Signs: ED Triage Vitals  Enc Vitals Group     BP 07/29/19 1326 (!) 155/73     Pulse Rate 07/29/19 1326 70     Resp 07/29/19 1326 18     Temp 07/29/19 1326 (!) 103.1 F (39.5 C)     Temp Source 07/29/19 1326 Oral     SpO2 07/29/19 1326 95 %     Weight 07/29/19 1327 175 lb (79.4 kg)     Height 07/29/19 1327 6' (1.829 m)     Head Circumference --      Peak Flow --  Pain Score 07/29/19 1326 10     Pain Loc --      Pain Edu? --      Excl. in [place]? --     Constitutional: Alert and oriented.  Head: Normocephalic. Atraumatic. Eyes: Conjunctivae clear. Sclera anicteric. Nose: No congestion. No rhinorrhea. Mouth/Throat: Mucous membranes are dry.  Neck: No stridor.   Cardiovascular: Normal rate, regular rhythm. Pacemaker in place. Extremities well perfused. Respiratory: Normal respiratory effort.  Lungs CTAB. Gastrointestinal: Soft.  Mild discomfort with palpation to the lower abdomen.  No rebound or guarding.  Non-distended.  Musculoskeletal: No lower extremity edema. No deformities. Neurologic:  Normal speech and language. No gross focal  neurologic deficits are appreciated.  Skin: Skin is warm, dry and intact. No rash noted. Psychiatric: Mood and affect are appropriate for situation.  EKG  Personally reviewed.   Rate: 70 Rhythm: Paced rhythm Intervals: Abnormal due to paced rhythm Paced rhythm, underlying atrial fibrillation, largely unchanged from prior Negative Sgarbossa criteria No STEMI    Radiology  XR: IMPRESSION:  No acute cardiopulmonary disease.   CT: IMPRESSION: 1. No acute findings are noted in the abdomen or pelvis to account for the patient's symptoms. 2. Aortic atherosclerosis. 3. Additional incidental findings, as above.    Procedures  Procedure(s) performed (including critical care):  .Critical Care Performed by: Lilia Pro., MD Authorized by: Lilia Pro., MD   Critical care provider statement:    Critical care time (minutes):  45   Critical care was necessary to treat or prevent imminent or life-threatening deterioration of the following conditions:  Sepsis   Critical care was time spent personally by me on the following activities:  Discussions with consultants, evaluation of patient's response to treatment, examination of patient, ordering and performing treatments and interventions, ordering and review of laboratory studies, ordering and review of radiographic studies, pulse oximetry, re-evaluation of patient's condition, obtaining history from patient or surrogate and review of old charts   3:56 PM Sepsis - Repeat Assessment performed. Remains HDS, cap refill less than two seconds. Lungs clear. Paced HR.    Initial Impression / Assessment and Plan / ED Course  85 y.o. male who presents to the ED for fever, nausea, vomiting, loose stool, malodorous urine, as above.  Ddx: UTI, colitis, diverticulitis, COVID  Plan: labs, urine, cultures, imaging. Will give empiric antibiotics and fluids given his significant fever and concern for sepsis.   Urinalysis overwhelmingly  consistent with infection.  Lactic acid within normal limits. Imaging negative.  Receiving antibiotics.  Will plan for admission. Discussed with hospitalist for admission. Added on stool studies as well per hospitalist.    Final Clinical Impression(s) / ED Diagnosis  Final diagnoses:  Fever in adult  Nausea and vomiting in adult  Urinary tract infection in elderly patient       Note:  This document was prepared using Dragon voice recognition software and may include unintentional dictation errors.     Lilia Pro., MD 07/29/19 [phone]

## 2019-07-29 NOTE — Progress Notes (Signed)
CODE SEPSIS - PHARMACY COMMUNICATION  **Broad Spectrum Antibiotics should be administered within 1 hour of Sepsis diagnosis**  Time Code Sepsis Called/Page Received: 1427   Antibiotics Ordered: Vancomycin/Cefepime/Metronidazole  Time of 1st antibiotic administration: 1458  Additional action taken by pharmacy: none  If necessary, Name of Provider/Nurse Contacted: [place], PharmD, BCPS Clinical Pharmacist 07/29/2019 2:22 PM

## 2019-07-29 NOTE — H&P (Signed)
[facility] [place] NAME: Jacob Glenn    MR#:  [id number]  DATE OF BIRTH:  1932-08-01  DATE OF ADMISSION:  07/29/2019  PRIMARY CARE PHYSICIAN: Dion Body, MD   REQUESTING/REFERRING PHYSICIAN: Monks  CHIEF COMPLAINT:   Chief Complaint  Patient presents with  . Abdominal Pain  . Fever  . Emesis    HISTORY OF PRESENT ILLNESS: Jacob Glenn  is a 85 y.o. male with a known history of atrial fibrillation, coronary artery disease, hypertension, sleep apnea, thyroid disease-came to the emergency room with complaint of nausea vomiting foul-smelling urine and fever for 1 day.  He had 3 episodes of loose stool before coming to ER.  He also had some lower abdominal pain.  ER physician did CT scan abdomen which was negative for acute finding.  COVID-19 test was sent which was negative.  Patient was found to have sepsis as per criteria.  His urinalysis was positive.  Started on antibiotic and given to hospitalist team for further management.  PAST MEDICAL HISTORY:   Past Medical History:  Diagnosis Date  . A-fib ([place])   . Coronary artery disease   . Hypertension   . Sleep apnea   . Thyroid disease     PAST SURGICAL HISTORY:  Past Surgical History:  Procedure Laterality Date  . BACK SURGERY    . HERNIA REPAIR    . PACEMAKER IMPLANT    . PROSTATE ABLATION      SOCIAL HISTORY:  Social History   Tobacco Use  . Smoking status: Never Smoker  . Smokeless tobacco: Never Used  Substance Use Topics  . Alcohol use: No    Frequency: Never    FAMILY HISTORY:  Family History  Problem Relation Age of Onset  . Hypertension Mother     DRUG ALLERGIES: No Known Allergies  REVIEW OF SYSTEMS:   CONSTITUTIONAL: Have fever, fatigue or weakness.  EYES: No blurred or double vision.  EARS, NOSE, AND THROAT: No tinnitus or ear pain.  RESPIRATORY: No cough, shortness of breath, wheezing or hemoptysis.  CARDIOVASCULAR: No chest pain, orthopnea,  edema.  GASTROINTESTINAL: Have nausea, vomiting, diarrhea or abdominal pain.  GENITOURINARY: No dysuria, hematuria.  ENDOCRINE: No polyuria, nocturia,  HEMATOLOGY: No anemia, easy bruising or bleeding SKIN: No rash or lesion. MUSCULOSKELETAL: No joint pain or arthritis.   NEUROLOGIC: No tingling, numbness, weakness.  PSYCHIATRY: No anxiety or depression.   MEDICATIONS AT HOME:  Prior to Admission medications   Medication Sig Start Date End Date Taking? Authorizing Provider  apixaban (ELIQUIS) 2.5 MG TABS tablet Take 2.5 mg by mouth 2 (two) times a day. 10/17/18  Yes [provider]  carvedilol (COREG) 3.125 MG tablet Take 3.125 mg by mouth daily.  10/17/18  Yes [provider]  doxazosin (CARDURA) 4 MG tablet Take 4 mg by mouth daily.   Yes [provider]  furosemide (LASIX) 40 MG tablet Take 40 mg by mouth daily. 11/20/18  Yes [provider]  levothyroxine (SYNTHROID) 100 MCG tablet Take 100 mcg by mouth daily. 07/03/18  Yes [provider]  losartan (COZAAR) 25 MG tablet Take 25 mg by mouth daily. 11/20/18 11/20/19 Yes [provider]  Vitamin D, Ergocalciferol, (DRISDOL) 1.25 MG (50000 UT) CAPS capsule Take 50,000 Units by mouth every 7 (seven) days. Sunday   Yes [provider]      PHYSICAL EXAMINATION:   VITAL SIGNS: Blood pressure 135/68, pulse 69, temperature 99.1 F (37.3 C),  temperature source Oral, resp. rate (!) 22, height 6' (1.829 m), weight 79.4 kg, SpO2 97 %.  GENERAL:  85 y.o.-year-old patient lying in the bed with no acute distress.  EYES: Pupils equal, round, reactive to light and accommodation. No scleral icterus. Extraocular muscles intact.  HEENT: Head atraumatic, normocephalic. Oropharynx and nasopharynx clear.  NECK:  Supple, no jugular venous distention. No thyroid enlargement, no tenderness.  LUNGS: Normal breath sounds bilaterally, no wheezing, rales,rhonchi or crepitation. No use of accessory  muscles of respiration.  CARDIOVASCULAR: S1, S2 normal. No murmurs, rubs, or gallops.  ABDOMEN: Soft, mild tender, nondistended. Bowel sounds present. No organomegaly or mass.  EXTREMITIES: No pedal edema, cyanosis, or clubbing.  NEUROLOGIC: Cranial nerves II through XII are intact. Muscle strength 4/5 in all extremities. Sensation intact. Gait not checked.  PSYCHIATRIC: The patient is alert and oriented x 3.  SKIN: No obvious rash, lesion, or ulcer.   LABORATORY PANEL:   CBC Recent Labs  Lab 07/29/19 1356  WBC 8.4  HGB 12.2*  HCT 35.9*  PLT 170  MCV 106.5*  MCH 36.2*  MCHC 34.0  RDW 14.7   ------------------------------------------------------------------------------------------------------------------  Chemistries  Recent Labs  Lab 07/29/19 1356  NA 134*  K 4.2  CL 96*  CO2 27  GLUCOSE 149*  BUN 32*  CREATININE 2.07*  CALCIUM 8.9  AST 29  ALT 19  ALKPHOS 68  BILITOT 2.4*   ------------------------------------------------------------------------------------------------------------------ estimated creatinine clearance is 28.6 mL/min (A) (by C-G formula based on SCr of 2.07 mg/dL (H)). ------------------------------------------------------------------------------------------------------------------ No results for input(s): TSH, T4TOTAL, T3FREE, THYROIDAB in the last 72 hours.  Invalid input(s): FREET3   Coagulation profile Recent Labs  Lab 07/29/19 1356  INR 1.5*   ------------------------------------------------------------------------------------------------------------------- No results for input(s): DDIMER in the last 72 hours. -------------------------------------------------------------------------------------------------------------------  Cardiac Enzymes No results for input(s): CKMB, TROPONINI, MYOGLOBIN in the last 168 hours.  Invalid input(s):  CK ------------------------------------------------------------------------------------------------------------------ Invalid input(s): POCBNP  ---------------------------------------------------------------------------------------------------------------  Urinalysis    Component Value Date/Time   COLORURINE YELLOW (A) 07/29/2019 1356   APPEARANCEUR CLOUDY (A) 07/29/2019 1356   LABSPEC 1.009 07/29/2019 1356   PHURINE 5.0 07/29/2019 1356   GLUCOSEU NEGATIVE 07/29/2019 1356   HGBUR MODERATE (A) 07/29/2019 1356   BILIRUBINUR NEGATIVE 07/29/2019 1356   KETONESUR NEGATIVE 07/29/2019 1356   PROTEINUR 30 (A) 07/29/2019 1356   NITRITE NEGATIVE 07/29/2019 1356   LEUKOCYTESUR LARGE (A) 07/29/2019 1356     RADIOLOGY: Ct Abdomen Pelvis Wo Contrast  Result Date: 07/29/2019 CLINICAL DATA:  85 year old male with history of mid abdominal pain since yesterday, with vomiting and some diarrhea as well as fever for the past 2 days. Weakness. EXAM: CT ABDOMEN AND PELVIS WITHOUT CONTRAST TECHNIQUE: Multidetector CT imaging of the abdomen and pelvis was performed following the standard protocol without IV contrast. COMPARISON:  No priors. FINDINGS: Lower chest: Atherosclerosis of the descending thoracic aorta. Pacemaker leads in the right atrium and right ventricle. Hepatobiliary: No suspicious cystic or solid hepatic lesions are confidently identified on today's noncontrast CT examination. Tiny calcified granuloma in segment 6 of the liver. Unenhanced appearance of the gallbladder is normal. Pancreas: No definite pancreatic mass or peripancreatic fluid collections or inflammatory changes noted on today's noncontrast CT examination. Spleen: Unremarkable. Adrenals/Urinary Tract: There are no abnormal calcifications within the collecting system of either kidney, along the course of either ureter, or within the lumen of the urinary bladder. No hydroureteronephrosis or perinephric stranding to suggest urinary tract  obstruction at this time. The unenhanced appearance of the  kidneys is unremarkable bilaterally. Unenhanced appearance of the urinary bladder is normal. Bilateral adrenal glands are normal in appearance. Stomach/Bowel: Normal appearance of the stomach. No pathologic dilatation of small bowel or colon. The appendix is not confidently identified and may be surgically absent. Regardless, there are no inflammatory changes noted adjacent to the cecum to suggest the presence of an acute appendicitis at this time. Vascular/Lymphatic: Aortic atherosclerosis. No lymphadenopathy noted in the abdomen or pelvis. Surgical clips along the pelvic sidewall bilaterally from prior lymph node dissection. Reproductive: Status post radical prostatectomy. Other: No significant volume of ascites.  No pneumoperitoneum. Musculoskeletal: There are no aggressive appearing lytic or blastic lesions noted in the visualized portions of the skeleton. IMPRESSION: 1. No acute findings are noted in the abdomen or pelvis to account for the patient's symptoms. 2. Aortic atherosclerosis. 3. Additional incidental findings, as above. Electronically Signed   By: Vinnie Langton M.D.   On: 07/29/2019 15:25   Dg Chest Port 1 View  Result Date: 07/29/2019 CLINICAL DATA:  Mid abdominal pain beginning yesterday with vomiting and diarrhea as well as fever. EXAM: PORTABLE CHEST 1 VIEW COMPARISON:  04/08/2019 FINDINGS: Patient rotated to the left. Left-sided pacemaker unchanged. Lungs are adequately inflated without focal airspace consolidation or effusion. Mild stable cardiomegaly. Remainder of the exam is unchanged. IMPRESSION: No acute cardiopulmonary disease. Electronically Signed   By: Marin Olp M.D.   On: 07/29/2019 14:34    EKG: Orders placed or performed during the hospital encounter of 07/29/19  . ED EKG  . ED EKG  . EKG 12-Lead  . EKG 12-Lead    IMPRESSION AND PLAN:  *Sepsis UTI  IV fluids, blood and urine cultures are sent.   Continue ceftriaxone. CT abdomen did not show any abnormalities.  *Chronic kidney disease stage III Continue to monitor with IV fluids.  Appears stable at this time.  *Atrial fibrillation Hold carvedilol because of sepsis. Continue Eliquis.  *Hypertension Hold losartan and carvedilol because of sepsis.  *Hypothyroidism Continue levothyroxine.  All the records are reviewed and case discussed with ED provider. Management plans discussed with the patient, family and they are in agreement.  CODE STATUS: DNR    Code Status Orders  (From admission, onward)         Start     Ordered   07/29/19 2011  Do not attempt resuscitation (DNR)  Continuous    Question Answer Comment  In the event of cardiac or respiratory ARREST Do not call a "code blue"   In the event of cardiac or respiratory ARREST Do not perform Intubation, CPR, defibrillation or ACLS   In the event of cardiac or respiratory ARREST Use medication by any route, position, wound care, and other measures to relive pain and suffering. May use oxygen, suction and manual treatment of airway obstruction as needed for comfort.      07/29/19 2010        Code Status History    This patient has a current code status but no historical code status.   Advance Care Planning Activity     Patient's wife was present in the room during my visit.  TOTAL TIME TAKING CARE OF THIS PATIENT: 45 minutes.    Vaughan Basta M.D on 07/29/2019   Between 7am to 6pm - Pager - [phone]  After 6pm go to [email or web address] - password [email or web address] [facility]  Office  [phone]  CC: Primary care physician; Dion Body, MD   Note: This dictation was prepared  with Dragon dictation along with smaller phrase technology. Any transcriptional errors that result from this process are unintentional.

## 2019-07-30 ENCOUNTER — Other Ambulatory Visit: Payer: Self-pay

## 2019-07-30 ENCOUNTER — Inpatient Hospital Stay: Payer: Medicare Other

## 2019-07-30 LAB — BLOOD CULTURE ID PANEL (REFLEXED)

## 2019-07-30 LAB — CBC
HCT: 33 % — ABNORMAL LOW (ref 39.0–52.0)
Hemoglobin: 10.8 g/dL — ABNORMAL LOW (ref 13.0–17.0)
MCH: 35.4 pg — ABNORMAL HIGH (ref 26.0–34.0)
MCHC: 32.7 g/dL (ref 30.0–36.0)
MCV: 108.2 fL — ABNORMAL HIGH (ref 80.0–100.0)
Platelets: 129 10*3/uL — ABNORMAL LOW (ref 150–400)
RBC: 3.05 MIL/uL — ABNORMAL LOW (ref 4.22–5.81)
RDW: 14.6 % (ref 11.5–15.5)
WBC: 5.6 10*3/uL (ref 4.0–10.5)
nRBC: 0 % (ref 0.0–0.2)

## 2019-07-30 LAB — BASIC METABOLIC PANEL
Anion gap: 9 (ref 5–15)
BUN: 38 mg/dL — ABNORMAL HIGH (ref 8–23)
CO2: 26 mmol/L (ref 22–32)
Calcium: 8.2 mg/dL — ABNORMAL LOW (ref 8.9–10.3)
Chloride: 103 mmol/L (ref 98–111)
Creatinine, Ser: 2.14 mg/dL — ABNORMAL HIGH (ref 0.61–1.24)
GFR calc Af Amer: 32 mL/min — ABNORMAL LOW (ref >60)
GFR calc non Af Amer: 27 mL/min — ABNORMAL LOW (ref >60)
Glucose, Bld: 107 mg/dL — ABNORMAL HIGH (ref 70–99)
Potassium: 3.9 mmol/L (ref 3.5–5.1)
Sodium: 138 mmol/L (ref 135–145)

## 2019-07-30 MED ORDER — SODIUM CHLORIDE 0.9 % IV SOLN
1.0000 g | Freq: Two times a day (BID) | INTRAVENOUS | Status: DC
  Administered 2019-07-30 – 2019-08-01 (×5): 1 g via INTRAVENOUS
  Filled 2019-07-30 (×7): qty 1

## 2019-07-30 MED ORDER — SODIUM CHLORIDE 0.9 % IV SOLN
2.0000 g | INTRAVENOUS | Status: DC
  Filled 2019-07-30: qty 20

## 2019-07-30 MED ORDER — IPRATROPIUM-ALBUTEROL 0.5-2.5 (3) MG/3ML IN SOLN
3.0000 mL | Freq: Three times a day (TID) | RESPIRATORY_TRACT | Status: DC
  Administered 2019-07-30 – 2019-07-31 (×2): 3 mL via RESPIRATORY_TRACT
  Filled 2019-07-30 (×2): qty 3

## 2019-07-30 MED ORDER — CARVEDILOL 6.25 MG PO TABS
3.1250 mg | ORAL_TABLET | Freq: Every day | ORAL | Status: DC
  Administered 2019-07-30 – 2019-08-01 (×3): 3.125 mg via ORAL
  Filled 2019-07-30 (×3): qty 1

## 2019-07-30 NOTE — Progress Notes (Addendum)
[facility] NAME: Jacob Glenn    MR#:  [id number]  DATE OF BIRTH:  06/05/1932  SUBJECTIVE:   Patient is a little bit sleepy this morning.  He denies any concerns.  He denies any fevers, chills, chest pain, shortness breath, abdominal pain.  No dysuria.  Per wife at bedside, his altered mental status has greatly improved from yesterday.  REVIEW OF SYSTEMS:  Review of Systems  Constitutional: Positive for malaise/fatigue. Negative for chills and fever.  HENT: Negative for congestion and sore throat.   Eyes: Negative for blurred vision and double vision.  Respiratory: Negative for cough and shortness of breath.   Cardiovascular: Negative for chest pain and palpitations.  Gastrointestinal: Negative for nausea and vomiting.  Genitourinary: Negative for dysuria and urgency.  Musculoskeletal: Negative for back pain and neck pain.  Neurological: Negative for dizziness and headaches.  Psychiatric/Behavioral: Negative for depression. The patient is not nervous/anxious.     DRUG ALLERGIES:  No Known Allergies VITALS:  Blood pressure 117/67, pulse 69, temperature 99.6 F (37.6 C), temperature source Oral, resp. rate 20, height 6' (1.829 m), weight 79.4 kg, SpO2 99 %. PHYSICAL EXAMINATION:  Physical Exam  GENERAL:  Laying in the bed with no acute distress. + Sleepy but easily arousable. HEENT: Head atraumatic, normocephalic. Pupils equal, round, reactive to light and accommodation. No scleral icterus. Extraocular muscles intact. Oropharynx and nasopharynx clear.  NECK:  Supple, no jugular venous distention. No thyroid enlargement. LUNGS: Lungs are clear to auscultation bilaterally. No wheezes, crackles, rhonchi. No use of accessory muscles of respiration.  Nasal cannula in place. CARDIOVASCULAR: RRR, S1, S2 normal. No murmurs, rubs, or gallops.  ABDOMEN: Soft, nontender, nondistended. Bowel sounds present.  EXTREMITIES: No pedal edema,  cyanosis, or clubbing.  NEUROLOGIC: CN 2-12 intact, no focal deficits. +global weakness. Sensation intact throughout. Gait not checked.  PSYCHIATRIC: The patient is alert and oriented x 3.  SKIN: No obvious rash, lesion, or ulcer.  LABORATORY PANEL:  Male CBC Recent Labs  Lab 07/30/19 0438  WBC 5.6  HGB 10.8*  HCT 33.0*  PLT 129*   ------------------------------------------------------------------------------------------------------------------ Chemistries  Recent Labs  Lab 07/29/19 1356 07/30/19 0438  NA 134* 138  K 4.2 3.9  CL 96* 103  CO2 27 26  GLUCOSE 149* 107*  BUN 32* 38*  CREATININE 2.07* 2.14*  CALCIUM 8.9 8.2*  AST 29  --   ALT 19  --   ALKPHOS 68  --   BILITOT 2.4*  --    RADIOLOGY:  Ct Head Wo Contrast  Result Date: 07/29/2019 CLINICAL DATA:  85 year old male fall from bed this evening. EXAM: CT HEAD WITHOUT CONTRAST TECHNIQUE: Contiguous axial images were obtained from the base of the skull through the vertex without intravenous contrast. COMPARISON:  Head CT 05/21/2019. FINDINGS: Brain: No midline shift, mass effect, or evidence of intracranial mass lesion. No ventriculomegaly. No acute intracranial hemorrhage identified. Patchy and confluent cerebral white matter hypodensity appears stable. No cortically based acute infarct identified. No cortical encephalomalacia identified. Vascular: Calcified atherosclerosis at the skull base. No suspicious intracranial vascular hyperdensity. Skull: Stable, intact. Sinuses/Orbits: Visualized paranasal sinuses and mastoids are stable and well pneumatized. Other: No scalp hematoma identified. Stable orbits. IMPRESSION: Stable since July. No acute intracranial abnormality or acute traumatic injury identified. Electronically Signed   By: Jacob Glenn M.D.   On: 07/29/2019 22:57   US Renal  Result Date: 07/30/2019 CLINICAL DATA:  UTI EXAM: RENAL /  URINARY TRACT ULTRASOUND COMPLETE COMPARISON:  None. FINDINGS: Right Kidney: Renal  measurements: 10.1 x 5.3 x 4.4 cm = volume: 123.8 mL . Echogenicity within normal limits. No mass or hydronephrosis visualized. Left Kidney: Renal measurements: 9.8 x 5.2 x 5.8 cm = volume: 154.2 mL. Echogenicity within normal limits. No mass or hydronephrosis visualized. Bladder: Appears normal for degree of bladder distention. IMPRESSION: Normal study. Electronically Signed   By: Jacob Glenn M.D   On: 07/30/2019 10:19   ASSESSMENT AND PLAN:   Sepsis secondary to UTI and E. coli bacteremia- meeting sepsis criteria on admission with fever and tachypnea.  UA consistent with infection. -CT abdomen/pelvis was negative -Blood culture growing E. coli in 1/4 bottles -Urine culture pending -Change from ceftriaxone to meropenem -Check renal ultrasound -PT consult  AKI in CKD Glenn- creatinine 2.14, 1.4-1.6 -Continue IV fluids -Renal ultrasound -Holding home losartan and Lasix -Avoid nephrotoxic agents  Acute metabolic encephalopathy-likely secondary to sepsis.  Altered mental status is improving per wife at bedside. -CT head negative -If no improvement, may need to consider additional imaging  Paroxysmal atrial fibrillation- in NSR here -Continue Eliquis and Coreg  Hypertension -Restart Coreg -Continue doxazosin -Holding home losartan in the setting of AKI  Hypothyroidism -Continue Synthroid  Macrocytic anemia- hemoglobin at baseline -Check anemia panel  Wife, Jacob Glenn, at bedside and updated on the plan.  All the records are reviewed and case discussed with Care Management/Social Worker. Management plans discussed with the patient, family and they are in agreement.  CODE STATUS: DNR  TOTAL TIME TAKING CARE OF THIS PATIENT: 40 minutes.   More than 50% of the time was spent in counseling/coordination of care: YES  POSSIBLE D/C IN 1-2 DAYS, DEPENDING ON CLINICAL CONDITION.   Jacob Glenn  M.D on 07/30/2019 at 4:24 PM  Between 7am to 6pm - Pager - [phone]  After  6pm go to [email or web address] - Proofreader  [employer] [facility]  Office  [phone]  CC: Primary care physician; Jacob Body, MD  Note: This dictation was prepared with Dragon dictation along with smaller phrase technology. Any transcriptional errors that result from this process are unintentional.

## 2019-07-30 NOTE — Progress Notes (Signed)
PHARMACY - PHYSICIAN COMMUNICATION CRITICAL VALUE ALERT - BLOOD CULTURE IDENTIFICATION (BCID)  Jacob Glenn is an 85 y.o. male who presented to [facility] on 07/29/2019 with a chief complaint of loose stools, n/v, fever, UTI.  Assessment:  1 of 4 bottles + for E Coli, KPC not detected  Name of physician (or Provider) Contacted: Dr Brett Albino  Current antibiotics: Rocephin 2gm IV q24hrs  Changes to prescribed antibiotics recommended: change to meropenem 1 gram IV every 12 hours  Results for orders placed or performed during the hospital encounter of 07/29/19  Blood Culture ID Panel (Reflexed) (Collected: 07/29/2019  2:47 PM)  Result Value Ref Range   Enterococcus species NOT DETECTED NOT DETECTED   Listeria monocytogenes NOT DETECTED NOT DETECTED   Staphylococcus species NOT DETECTED NOT DETECTED   Staphylococcus aureus (BCID) NOT DETECTED NOT DETECTED   Streptococcus species NOT DETECTED NOT DETECTED   Streptococcus agalactiae NOT DETECTED NOT DETECTED   Streptococcus pneumoniae NOT DETECTED NOT DETECTED   Streptococcus pyogenes NOT DETECTED NOT DETECTED   Acinetobacter baumannii NOT DETECTED NOT DETECTED   Enterobacteriaceae species DETECTED (A) NOT DETECTED   Enterobacter cloacae complex NOT DETECTED NOT DETECTED   Escherichia coli DETECTED (A) NOT DETECTED   Klebsiella oxytoca NOT DETECTED NOT DETECTED   Klebsiella pneumoniae NOT DETECTED NOT DETECTED   Proteus species NOT DETECTED NOT DETECTED   Serratia marcescens NOT DETECTED NOT DETECTED   Carbapenem resistance NOT DETECTED NOT DETECTED   Haemophilus influenzae NOT DETECTED NOT DETECTED   Neisseria meningitidis NOT DETECTED NOT DETECTED   Pseudomonas aeruginosa NOT DETECTED NOT DETECTED   Candida albicans NOT DETECTED NOT DETECTED   Candida glabrata NOT DETECTED NOT DETECTED   Candida krusei NOT DETECTED NOT DETECTED   Candida parapsilosis NOT DETECTED NOT DETECTED   Candida tropicalis NOT DETECTED NOT DETECTED    Dallie Piles, PharmD 07/30/2019  2:46 PM

## 2019-07-30 NOTE — Progress Notes (Signed)
PT Cancellation Note  Patient Details Name: Jacob Glenn MRN: [id number] DOB: May 09, 1932   Cancelled Treatment:    Reason Eval/Treat Not Completed: Fatigue/lethargy limiting ability to participate(Consult received and chart reviewed. Patient sleeping soundly upon arrival room.  Opens eyes, responds to light touch/voice, but unable to maintain alertness for full mobility assessment.  Will re-attempt at later time/date as appropriate and available.)  [name] H. Owens Shark, PT, DPT, NCS 07/30/19, 11:48 AM [phone]

## 2019-07-30 NOTE — Progress Notes (Signed)
Patient had unwitnessed fall at 2200. Bed alarm had been set and went off as patient in his own words "rolled off the bed ". Staff assisted patient into bed. VSS. Nurse noted small bruise to area above right forehead/ lateral side. Nurse contacted Sherrilyn Rist and CT ordered. Patient never had change in LOC, alert and oriented throughout the night.Nursing supervisor Malachy Mood was informed of events.  Nurse contacted patient's spouse Atilano Krantz and informed her of what transpired. Patient taken to CT. Patient's spouse had asked if she could spend night, Nursing Supervisor said it was ok and spouse returned to hospital for the night. Patient has not had a change in behavior, no change in LOC or status, no change in ROM.

## 2019-07-30 NOTE — Progress Notes (Signed)
PHARMACY - PHYSICIAN COMMUNICATION CRITICAL VALUE ALERT - BLOOD CULTURE IDENTIFICATION (BCID)  Jacob Glenn is an 85 y.o. male who presented to [facility] on 07/29/2019 with a chief complaint of loose stools, n/v, fever, UTI.  Assessment:  1 of 4 bottles + for E Coli, KPC not detected  Name of physician (or Provider) Contacted: Dr Sidney Ace  Current antibiotics: Rocephin 1gm IV q24hrs  Changes to prescribed antibiotics recommended: Increase Rocephin to 2gm q24hrs  Results for orders placed or performed during the hospital encounter of 07/29/19  Blood Culture ID Panel (Reflexed) (Collected: 07/29/2019  2:47 PM)  Result Value Ref Range   Enterococcus species NOT DETECTED NOT DETECTED   Listeria monocytogenes NOT DETECTED NOT DETECTED   Staphylococcus species NOT DETECTED NOT DETECTED   Staphylococcus aureus (BCID) NOT DETECTED NOT DETECTED   Streptococcus species NOT DETECTED NOT DETECTED   Streptococcus agalactiae NOT DETECTED NOT DETECTED   Streptococcus pneumoniae NOT DETECTED NOT DETECTED   Streptococcus pyogenes NOT DETECTED NOT DETECTED   Acinetobacter baumannii NOT DETECTED NOT DETECTED   Enterobacteriaceae species DETECTED (A) NOT DETECTED   Enterobacter cloacae complex NOT DETECTED NOT DETECTED   Escherichia coli DETECTED (A) NOT DETECTED   Klebsiella oxytoca NOT DETECTED NOT DETECTED   Klebsiella pneumoniae NOT DETECTED NOT DETECTED   Proteus species NOT DETECTED NOT DETECTED   Serratia marcescens NOT DETECTED NOT DETECTED   Carbapenem resistance NOT DETECTED NOT DETECTED   Haemophilus influenzae NOT DETECTED NOT DETECTED   Neisseria meningitidis NOT DETECTED NOT DETECTED   Pseudomonas aeruginosa NOT DETECTED NOT DETECTED   Candida albicans NOT DETECTED NOT DETECTED   Candida glabrata NOT DETECTED NOT DETECTED   Candida krusei NOT DETECTED NOT DETECTED   Candida parapsilosis NOT DETECTED NOT DETECTED   Candida tropicalis NOT DETECTED NOT DETECTED    [name]  A 07/30/2019  5:29 AM

## 2019-07-30 NOTE — Plan of Care (Signed)
  Problem: Education: Goal: Knowledge of General Education information will improve Description Including pain rating scale, medication(s)/side effects and non-pharmacologic comfort measures Outcome: Progressing   

## 2019-07-31 LAB — BASIC METABOLIC PANEL
Anion gap: 11 (ref 5–15)
BUN: 45 mg/dL — ABNORMAL HIGH (ref 8–23)
CO2: 25 mmol/L (ref 22–32)
Calcium: 8 mg/dL — ABNORMAL LOW (ref 8.9–10.3)
Chloride: 99 mmol/L (ref 98–111)
Creatinine, Ser: 2.18 mg/dL — ABNORMAL HIGH (ref 0.61–1.24)
GFR calc Af Amer: 31 mL/min — ABNORMAL LOW (ref >60)
GFR calc non Af Amer: 27 mL/min — ABNORMAL LOW (ref >60)
Glucose, Bld: 97 mg/dL (ref 70–99)
Potassium: 3.9 mmol/L (ref 3.5–5.1)
Sodium: 135 mmol/L (ref 135–145)

## 2019-07-31 LAB — CBC
HCT: 30.4 % — ABNORMAL LOW (ref 39.0–52.0)
Hemoglobin: 10.2 g/dL — ABNORMAL LOW (ref 13.0–17.0)
MCH: 35.9 pg — ABNORMAL HIGH (ref 26.0–34.0)
MCHC: 33.6 g/dL (ref 30.0–36.0)
MCV: 107 fL — ABNORMAL HIGH (ref 80.0–100.0)
Platelets: 111 10*3/uL — ABNORMAL LOW (ref 150–400)
RBC: 2.84 MIL/uL — ABNORMAL LOW (ref 4.22–5.81)
RDW: 14.6 % (ref 11.5–15.5)
WBC: 3.5 10*3/uL — ABNORMAL LOW (ref 4.0–10.5)
nRBC: 0 % (ref 0.0–0.2)

## 2019-07-31 LAB — IRON AND TIBC
Iron: 14 ug/dL — ABNORMAL LOW (ref 45–182)
Saturation Ratios: 6 % — ABNORMAL LOW (ref 17.9–39.5)
TIBC: 228 ug/dL — ABNORMAL LOW (ref 250–450)
UIBC: 214 ug/dL

## 2019-07-31 LAB — FERRITIN: Ferritin: 429 ng/mL — ABNORMAL HIGH (ref 24–336)

## 2019-07-31 LAB — VITAMIN B12: Vitamin B-12: <50 pg/mL — ABNORMAL LOW (ref 180–914)

## 2019-07-31 LAB — FOLATE: Folate: 16.4 ng/mL (ref >5.9)

## 2019-07-31 MED ORDER — ALBUTEROL SULFATE (2.5 MG/3ML) 0.083% IN NEBU: 2.5000 mg | INHALATION_SOLUTION | RESPIRATORY_TRACT | Status: DC | PRN

## 2019-07-31 MED ORDER — FUROSEMIDE 20 MG PO TABS
20.0000 mg | ORAL_TABLET | Freq: Every day | ORAL | Status: DC
  Administered 2019-07-31 – 2019-08-01 (×2): 20 mg via ORAL
  Filled 2019-07-31 (×2): qty 1

## 2019-07-31 NOTE — Progress Notes (Signed)
Physical Therapy Treatment Patient Details Name: Jacob Glenn MRN: [id number] DOB: 08-Mar-1932 Today's Date: 07/31/2019    History of Present Illness Pt is 85 yo male admitted for sepsis and AMS, workup found UTI. PMH of CKDIII, macrocytic anemia, afib, HTN, pacemaker    PT Comments    Additional gait trial with RW, cga/close sup; good carry-over of education/information.  Mild fatigue evident, occasional R knee buckling end of distance.  Recommend continued use of RW for optimal safety/indep with gait efforts.  Patient/wife voiced understanding.    Follow Up Recommendations  Outpatient PT     Equipment Recommendations  Rolling walker with 5" wheels    Recommendations for Other Services       Precautions / Restrictions Precautions Precautions: Fall Precaution Comments: Pt with very limited L shoulder mobility Restrictions Weight Bearing Restrictions: No    Mobility  Bed Mobility Overal bed mobility: Modified Independent Bed Mobility: Sit to Supine           General bed mobility comments: Pt in recliner at start of session  Transfers Overall transfer level: Needs assistance Equipment used: Rolling walker (2 wheeled) Transfers: Sit to/from Stand Sit to Stand: Independent         General transfer comment: good carry-over of hand placement  Ambulation/Gait Ambulation/Gait assistance: Min guard;Supervision Gait Distance (Feet): 200 Feet Assistive device: Rolling walker (2 wheeled)       General Gait Details: reciprocal stepping pattern with forward flexed posture; 1-2 mild episodes of R knee buckling towards end of distance (self recovers).  Mild fatigue reported; sats >92% on RA throguhout   Stairs             Wheelchair Mobility    Modified Rankin (Stroke Patients Only)       Balance Overall balance assessment: Needs assistance Sitting-balance support: No upper extremity supported;Feet supported Sitting balance-Leahy Scale: Good      Standing balance support: Bilateral upper extremity supported Standing balance-Leahy Scale: Fair                              Cognition Arousal/Alertness: Awake/alert Behavior During Therapy: WFL for tasks assessed/performed Overall Cognitive Status: Within Functional Limits for tasks assessed                                        Exercises      General Comments        Pertinent Vitals/Pain Pain Assessment: No/denies pain(generalized soreness, weakness)    Home Living Family/patient expects to be discharged to:: Private residence Living Arrangements: Spouse/significant other Available Help at Discharge: Family Type of Home: House Home Access: Stairs to enter Entrance Stairs-Rails: Right Home Layout: One level Home Equipment: Environmental consultant - 2 wheels;Cane - single point Additional Comments: Pt may have RW at home, wife will be checking    Prior Function Level of Independence: Independent      Comments: mows lawn, gardens, drives. no falls in the last 6 months (except out of hospital bed during this stay)   PT Goals (current goals can now be found in the care plan section) Acute Rehab PT Goals Patient Stated Goal: to go home PT Goal Formulation: With patient Time For Goal Achievement: 08/14/19 Potential to Achieve Goals: Good Progress towards PT goals: Progressing toward goals    Frequency    Min 2X/week  PT Plan      Co-evaluation              AM-PAC PT "6 Clicks" Mobility   Outcome Measure  Help needed turning from your back to your side while in a flat bed without using bedrails?: None Help needed moving from lying on your back to sitting on the side of a flat bed without using bedrails?: None Help needed moving to and from a bed to a chair (including a wheelchair)?: A Little Help needed standing up from a chair using your arms (e.g., wheelchair or bedside chair)?: None Help needed to walk in hospital room?: A  Little Help needed climbing 3-5 steps with a railing? : A Little 6 Click Score: 21    End of Session Equipment Utilized During Treatment: Gait belt Activity Tolerance: Patient tolerated treatment well Patient left: with family/visitor present;with call bell/phone within reach;in bed;with bed alarm set Nurse Communication: Mobility status PT Visit Diagnosis: Other abnormalities of gait and mobility (R26.89);Muscle weakness (generalized) (M62.81);Difficulty in walking, not elsewhere classified (R26.2)     Time: 1100-1119 PT Time Calculation (min) (ACUTE ONLY): 19 min  Charges:  $Gait Training: 8-22 mins $Therapeutic Exercise: 8-22 mins                     Arli Bree H. Owens Shark, PT, DPT, NCS 07/31/19, 11:34 AM [phone]

## 2019-07-31 NOTE — Evaluation (Signed)
Physical Therapy Evaluation Patient Details Name: Jacob Glenn MRN: [id number] DOB: 1932/08/13 Today's Date: 07/31/2019   History of Present Illness  Pt is 85 yo male admitted for sepsis and AMS, workup found UTI. PMH of CKDIII, macrocytic anemia, afib, HTN, pacemaker    Clinical Impression  Patient up in recliner when PT entered, A&Ox4, family at bedside. Pt reported living with his wife in a one story home, previously independent, drives, performs yard work/gardening, etc. No falls in the last 6 months prior to hospital admission.   The patient demonstrated sit <> stand transfer with CGA and RW, verbal cues for hand placement. Pt ambulated ~277ft with RW and CGA. Pt with 1-2 standing rest breaks via PT to assess O2, allow patient to rest. mild-mod increase in work of breathing noted. Overall patient steady with RW, initial education needed for RW use, good carryover in learning. Pt up in chair all needs in reach after ambulation, reported fatigue. SpO2 >90% on room air throughout mobility. The patient would benefit from further skilled PT to return to PLOF, as well as address deficits in L shoulder ROM and shoulder pain. Recommendation is outpatient PT, case management notified.    Follow Up Recommendations Outpatient PT    Equipment Recommendations  Rolling walker with 5" wheels;Other (comment)(Pt may have RW at home, case management notified)    Recommendations for Other Services       Precautions / Restrictions Precautions Precautions: Fall Precaution Comments: Pt with very limited L shoulder mobility Restrictions Weight Bearing Restrictions: No      Mobility  Bed Mobility               General bed mobility comments: Pt in recliner at start of session  Transfers Overall transfer level: Needs assistance Equipment used: Rolling walker (2 wheeled) Transfers: Sit to/from Stand Sit to Stand: Min guard         General transfer comment: CGA, cues for hand  placement  Ambulation/Gait Ambulation/Gait assistance: Min guard;Supervision Gait Distance (Feet): 200 Feet Assistive device: Rolling walker (2 wheeled)       General Gait Details: Pt with 1-2 standing rest breaks via PT to assess O2, allow patient to rest. mild-mod increase in work of breathing noted. Overall patient steady with RW, initial education needed for RW use, good carryover in learning.  Stairs            Wheelchair Mobility    Modified Rankin (Stroke Patients Only)       Balance Overall balance assessment: Needs assistance Sitting-balance support: Feet supported Sitting balance-Leahy Scale: Good       Standing balance-Leahy Scale: Fair                               Pertinent Vitals/Pain Pain Assessment: (pt endorsed generalized body pain, no acute pain)    Home Living Family/patient expects to be discharged to:: Private residence Living Arrangements: Spouse/significant other Available Help at Discharge: Family Type of Home: House Home Access: Stairs to enter Entrance Stairs-Rails: Right Entrance Stairs-Number of Steps: 2 Home Layout: One level Home Equipment: Walker - 2 wheels;Cane - single point Additional Comments: Pt may have RW at home, wife will be checking    Prior Function Level of Independence: Independent         Comments: mows lawn, gardens, drives. no falls in the last 6 months (except out of hospital bed during this stay)     Hand  Dominance   Dominant Hand: Right    Extremity/Trunk Assessment   Upper Extremity Assessment Upper Extremity Assessment: RUE deficits/detail;LUE deficits/detail RUE Deficits / Details: some difficulty with shoulder flexion ROM and strength 3+/5, elbow 4/5, grip 4/5 LUE Deficits / Details: unable to flexion L shoulder >45deg without assistance ( chronic issue), elbow 4/5, grip 4+/5    Lower Extremity Assessment Lower Extremity Assessment: RLE deficits/detail;LLE deficits/detail RLE  Deficits / Details: hip flexion 4-/5, knee and ankle 4/5 LLE Deficits / Details: hip flexion, knee, and ankle strength 4/5       Communication   Communication: No difficulties  Cognition Arousal/Alertness: Awake/alert Behavior During Therapy: WFL for tasks assessed/performed Overall Cognitive Status: Within Functional Limits for tasks assessed                                        General Comments      Exercises     Assessment/Plan    PT Assessment Patient needs continued PT services  PT Problem List Decreased strength;Decreased mobility;Decreased range of motion;Decreased activity tolerance;Decreased balance;Decreased knowledge of use of DME       PT Treatment Interventions DME instruction;Therapeutic exercise;Gait training;Balance training;Stair training;Neuromuscular re-education;Therapeutic activities;Patient/family education    PT Goals (Current goals can be found in the Care Plan section)  Acute Rehab PT Goals Patient Stated Goal: to go home PT Goal Formulation: With patient Time For Goal Achievement: 08/14/19 Potential to Achieve Goals: Good    Frequency Min 2X/week   Barriers to discharge        Co-evaluation               AM-PAC PT "6 Clicks" Mobility  Outcome Measure Help needed turning from your back to your side while in a flat bed without using bedrails?: A Little Help needed moving from lying on your back to sitting on the side of a flat bed without using bedrails?: A Little Help needed moving to and from a bed to a chair (including a wheelchair)?: A Little Help needed standing up from a chair using your arms (e.g., wheelchair or bedside chair)?: None Help needed to walk in hospital room?: None Help needed climbing 3-5 steps with a railing? : A Little 6 Click Score: 20    End of Session Equipment Utilized During Treatment: Gait belt Activity Tolerance: Patient tolerated treatment well Patient left: in chair;with chair alarm  set;with family/visitor present;with call bell/phone within reach Nurse Communication: Mobility status PT Visit Diagnosis: Other abnormalities of gait and mobility (R26.89);Muscle weakness (generalized) (M62.81);Difficulty in walking, not elsewhere classified (R26.2)    Time: [id number] PT Time Calculation (min) (ACUTE ONLY): 35 min   Charges:   PT Evaluation $PT Eval Low Complexity: 1 Low PT Treatments $Therapeutic Exercise: 8-22 mins        Lieutenant Diego PT, DPT 11:10 AM,07/31/19 [phone]

## 2019-07-31 NOTE — Plan of Care (Signed)
Pt has had one episode of chills. Warm blanket applied and tylenol given. MD aware . No new order at that time.

## 2019-07-31 NOTE — Progress Notes (Signed)
[place] [facility] [place] NAME: Jacob Glenn    MR#:  [id number]  DATE OF BIRTH:  1932/04/28  SUBJECTIVE:   Patient states he is feeling much better today.  He denies any fevers or chills.  No chest pain, shortness of breath, or abdominal pain.  Eating and drinking well.  He states he slept very well overnight.  REVIEW OF SYSTEMS:  Review of Systems  Constitutional: Positive for malaise/fatigue. Negative for chills and fever.  HENT: Negative for congestion and sore throat.   Eyes: Negative for blurred vision and double vision.  Respiratory: Negative for cough and shortness of breath.   Cardiovascular: Negative for chest pain and palpitations.  Gastrointestinal: Negative for nausea and vomiting.  Genitourinary: Negative for dysuria and urgency.  Musculoskeletal: Negative for back pain and neck pain.  Neurological: Negative for dizziness and headaches.  Psychiatric/Behavioral: Negative for depression. The patient is not nervous/anxious.     DRUG ALLERGIES:  No Known Allergies VITALS:  Blood pressure (!) 97/57, pulse 70, temperature 98.6 F (37 C), temperature source Oral, resp. rate 20, height 6' (1.829 m), weight 79.4 kg, SpO2 98 %. PHYSICAL EXAMINATION:  Physical Exam  GENERAL:   Sitting up in chair eating breakfast, in no acute distress.  Well-appearing. HEENT: Head atraumatic, normocephalic. Pupils equal, round, reactive to light and accommodation. No scleral icterus. Extraocular muscles intact. Oropharynx and nasopharynx clear.  NECK:  Supple, no jugular venous distention. No thyroid enlargement. LUNGS: Lungs are clear to auscultation bilaterally. No wheezes, crackles, rhonchi. No use of accessory muscles of respiration.  Nasal cannula in place. CARDIOVASCULAR: RRR, S1, S2 normal. No murmurs, rubs, or gallops.  ABDOMEN: Soft, nontender, nondistended. Bowel sounds present.  EXTREMITIES: No pedal edema, cyanosis, or clubbing.  NEUROLOGIC:  CN 2-12 intact, no focal deficits.  5/5 muscle strength in all extremities. Sensation intact throughout. Gait not checked.  PSYCHIATRIC: The patient is alert and oriented x 3.  SKIN: No obvious rash, lesion, or ulcer.  LABORATORY PANEL:  Male CBC Recent Labs  Lab 07/31/19 0516  WBC 3.5*  HGB 10.2*  HCT 30.4*  PLT 111*   ------------------------------------------------------------------------------------------------------------------ Chemistries  Recent Labs  Lab 07/29/19 1356  07/31/19 0516  NA 134*   < > 135  K 4.2   < > 3.9  CL 96*   < > 99  CO2 27   < > 25  GLUCOSE 149*   < > 97  BUN 32*   < > 45*  CREATININE 2.07*   < > 2.18*  CALCIUM 8.9   < > 8.0*  AST 29  --   --   ALT 19  --   --   ALKPHOS 68  --   --   BILITOT 2.4*  --   --    < > = values in this interval not displayed.   RADIOLOGY:  No results found. ASSESSMENT AND PLAN:   Sepsis secondary to E. coli UTI and bacteremia- meeting sepsis criteria on admission with fever and tachypnea, but sepsis has resolved. -CT abdomen/pelvis was negative -Blood culture growing E. coli in 1/4 bottles -Urine culture pending -Continue meropenem -Awaiting urine culture susceptibilities -Renal ultrasound was unremarkable -PT consult-recommended outpatient PT  AKI in CKD III- creatinine 2.14, baseline 1.4-1.6 -Stop IV fluids -Restart home lasix today due to some bibasilar crackles -Holding home losartan  -Renal US was unremarkable  Acute metabolic encephalopathy- resolved.  Patient is back this mental status baseline this morning. -  CT head negative  Paroxysmal atrial fibrillation- in NSR here -Continue Eliquis and Coreg  Hypertension -Continue doxazosin and Coreg -Holding home losartan in the setting of AKI  Hypothyroidism -Continue Synthroid  Macrocytic anemia- hemoglobin at baseline -Anemia panel with low iron and saturation ratios -Hold off on IV iron in the setting of active infection  Wife, [name],  at bedside and updated on the plan.  All the records are reviewed and case discussed with Care Management/Social Worker. Management plans discussed with the patient, family and they are in agreement.  CODE STATUS: DNR  TOTAL TIME TAKING CARE OF THIS PATIENT: 40 minutes.   More than 50% of the time was spent in counseling/coordination of care: YES  POSSIBLE D/C IN 1-2 DAYS, DEPENDING ON CLINICAL CONDITION.   Berna Spare Davidmichael Zarazua M.D on 07/31/2019 at 2:56 PM  Between 7am to 6pm - Pager [phone]  After 6pm go to [email or web address] - Proofreader  [facility]  Office  [phone]  CC: Primary care physician; Dion Body, MD  Note: This dictation was prepared with Dragon dictation along with smaller phrase technology. Any transcriptional errors that result from this process are unintentional.

## 2019-08-01 LAB — CULTURE, BLOOD (ROUTINE X 2): Special Requests: ADEQUATE

## 2019-08-01 LAB — CBC
HCT: 29.4 % — ABNORMAL LOW (ref 39.0–52.0)
Hemoglobin: 10.1 g/dL — ABNORMAL LOW (ref 13.0–17.0)
MCH: 35.9 pg — ABNORMAL HIGH (ref 26.0–34.0)
MCHC: 34.4 g/dL (ref 30.0–36.0)
MCV: 104.6 fL — ABNORMAL HIGH (ref 80.0–100.0)
Platelets: 117 10*3/uL — ABNORMAL LOW (ref 150–400)
RBC: 2.81 MIL/uL — ABNORMAL LOW (ref 4.22–5.81)
RDW: 14.1 % (ref 11.5–15.5)
WBC: 2.7 10*3/uL — ABNORMAL LOW (ref 4.0–10.5)
nRBC: 0 % (ref 0.0–0.2)

## 2019-08-01 LAB — URINE CULTURE: Culture: >=100000 — AB

## 2019-08-01 LAB — BASIC METABOLIC PANEL
Anion gap: 7 (ref 5–15)
BUN: 45 mg/dL — ABNORMAL HIGH (ref 8–23)
CO2: 24 mmol/L (ref 22–32)
Calcium: 8 mg/dL — ABNORMAL LOW (ref 8.9–10.3)
Chloride: 103 mmol/L (ref 98–111)
Creatinine, Ser: 1.99 mg/dL — ABNORMAL HIGH (ref 0.61–1.24)
GFR calc Af Amer: 34 mL/min — ABNORMAL LOW (ref >60)
GFR calc non Af Amer: 30 mL/min — ABNORMAL LOW (ref >60)
Glucose, Bld: 103 mg/dL — ABNORMAL HIGH (ref 70–99)
Potassium: 3.8 mmol/L (ref 3.5–5.1)
Sodium: 134 mmol/L — ABNORMAL LOW (ref 135–145)

## 2019-08-01 MED ORDER — DOXAZOSIN MESYLATE 4 MG PO TABS: 4.0000 mg | ORAL_TABLET | Freq: Every day | ORAL | 0 refills | Status: DC

## 2019-08-01 MED ORDER — CEPHALEXIN 500 MG PO CAPS: 500.0000 mg | ORAL_CAPSULE | Freq: Two times a day (BID) | ORAL | 0 refills | Status: AC

## 2019-08-01 MED ORDER — FUROSEMIDE 40 MG PO TABS: 40.0000 mg | ORAL_TABLET | Freq: Every day | ORAL | 0 refills | Status: DC

## 2019-08-01 MED ORDER — CEPHALEXIN 500 MG PO CAPS: 500.0000 mg | ORAL_CAPSULE | Freq: Two times a day (BID) | ORAL | 0 refills | Status: DC

## 2019-08-01 MED ORDER — CEPHALEXIN 500 MG PO CAPS: 500.0000 mg | ORAL_CAPSULE | Freq: Two times a day (BID) | ORAL | Status: DC

## 2019-08-01 NOTE — Care Management Important Message (Signed)
Important Message  Patient Details  Name: Jacob Glenn MRN: [id number] Date of Birth: [birth year]   Medicare Important Message Given:  Yes     Dannette Barbara 08/01/2019, 1:47 PM

## 2019-08-01 NOTE — Care Management (Signed)
RW delivered to room prior to discharge

## 2019-08-01 NOTE — Progress Notes (Signed)
Patient cleared for discharge.        Education complete. AVS printed. Discharge instructions given. All questions answered for patient clarification.  Prescriptions given, pharmacy verified.  IV removed. RW given. Discharged to home via POV

## 2019-08-01 NOTE — Discharge Summary (Signed)
[facility] NAME: Jacob Glenn    MR#:  [id number]  DATE OF BIRTH:  [birth year]  DATE OF ADMISSION:  07/29/2019 ADMITTING PHYSICIAN: Vaughan Basta, MD  DATE OF DISCHARGE: 08/01/2019  PRIMARY CARE PHYSICIAN: Dion Body, MD   ADMISSION DIAGNOSIS:  Nausea and vomiting in adult [R11.2] Fever in adult [R50.9] Urinary tract infection in elderly patient [N39.0]  DISCHARGE DIAGNOSIS:  Principal Problem:   Sepsis ([place]) Active Problems:   Acute lower UTI E. coli urinary tract infection Acute kidney injury on CKD stage III E. coli bacteremia SECONDARY DIAGNOSIS:   Past Medical History:  Diagnosis Date  . A-fib ([place])   . Coronary artery disease   . Hypertension   . Sleep apnea   . Thyroid disease      ADMITTING HISTORY Jacob Glenn  is a 85 y.o. male with a known history of atrial fibrillation, coronary artery disease, hypertension, sleep apnea, thyroid disease-came to the emergency room with complaint of nausea vomiting foul-smelling urine and fever for 1 day.  He had 3 episodes of loose stool before coming to ER.  He also had some lower abdominal pain.  ER physician did CT scan abdomen which was negative for acute finding.  COVID-19 test was sent which was negative.  Patient was found to have sepsis as per criteria.  His urinalysis was positive.  Started on antibiotic and given to hospitalist team for further management.  HOSPITAL COURSE:  Patient was admitted for sepsis secondary to UTI and started on IV Rocephin antibiotic.  Lasix was held patient was hydrated with IV fluids for acute kidney injury on CKD stage III.  Patient continued anticoagulation with Eliquis for paroxysmal atrial fibrillation.  Patient blood cultures grew E. coli and urine culture also grew E. Coli.  Patient continued antibiotics fevers resolved blood pressure improved and leukocytosis improved.  He was switched to oral Keflex antibiotic based on  culture and sensitivities.  Patient will be discharged home with outpatient physical therapy and a rolling walker.  He was worked up with CT abdomen in the emergency room which showed no acute pathology.  COVID-19 test is negative.  Creatinine also improved during the hospitalization.  CONSULTS OBTAINED:    DRUG ALLERGIES:  No Known Allergies  DISCHARGE MEDICATIONS:   Allergies as of 08/01/2019   No Known Allergies     Medication List    TAKE these medications   apixaban 2.5 MG Tabs tablet Commonly known as: ELIQUIS Take 2.5 mg by mouth 2 (two) times a day.   carvedilol 3.125 MG tablet Commonly known as: COREG Take 3.125 mg by mouth 2 (two) times daily with a meal.   cephALEXin 500 MG capsule Commonly known as: KEFLEX Take 1 capsule (500 mg total) by mouth every 12 (twelve) hours for 7 days.   doxazosin 4 MG tablet Commonly known as: CARDURA Take 1 tablet (4 mg total) by mouth daily.   furosemide 40 MG tablet Commonly known as: LASIX Take 1 tablet (40 mg total) by mouth daily. Start taking on: August 03, 2019 What changed: These instructions start on August 03, 2019. If you are unsure what to do until then, ask your doctor or other care provider.   levothyroxine 100 MCG tablet Commonly known as: SYNTHROID Take 100 mcg by mouth daily.   losartan 25 MG tablet Commonly known as: COZAAR Take 25 mg by mouth daily.   Vitamin D (Ergocalciferol) 1.25 MG (50000 UT) Caps capsule Commonly known  as: DRISDOL Take 50,000 Units by mouth every 7 (seven) days. Sunday            Durable Medical Equipment  (From admission, onward)         Start     Ordered   08/01/19 1039  For home use only DME Walker rolling  Once    Question:  Patient needs a walker to treat with the following condition  Answer:  Ambulatory dysfunction   08/01/19 1038   07/31/19 1457  For home use only DME Walker rolling  Once    Question:  Patient needs a walker to treat with the following condition   Answer:  Generalized weakness   07/31/19 1456          Today  Patient seen and evaluated today No fever Tolerating diet well Hemodynamically stable VITAL SIGNS:  Blood pressure 103/62, pulse 70, temperature 98 F (36.7 C), temperature source Oral, resp. rate 20, height 6' (1.829 m), weight 79.4 kg, SpO2 97 %.  I/O:    Intake/Output Summary (Last 24 hours) at 08/01/2019 1048 Last data filed at 08/01/2019 0910 Gross per 24 hour  Intake 438.86 ml  Output 1250 ml  Net -811.14 ml    PHYSICAL EXAMINATION:  Physical Exam  GENERAL:  85 y.o.-year-old patient lying in the bed with no acute distress.  LUNGS: Normal breath sounds bilaterally, no wheezing, rales,rhonchi or crepitation. No use of accessory muscles of respiration.  CARDIOVASCULAR: S1, S2 normal. No murmurs, rubs, or gallops.  ABDOMEN: Soft, non-tender, non-distended. Bowel sounds present. No organomegaly or mass.  NEUROLOGIC: Moves all 4 extremities. PSYCHIATRIC: The patient is alert and oriented x 3.  SKIN: No obvious rash, lesion, or ulcer.   DATA REVIEW:   CBC Recent Labs  Lab 08/01/19 0458  WBC 2.7*  HGB 10.1*  HCT 29.4*  PLT 117*    Chemistries  Recent Labs  Lab 07/29/19 1356  08/01/19 0458  NA 134*   < > 134*  K 4.2   < > 3.8  CL 96*   < > 103  CO2 27   < > 24  GLUCOSE 149*   < > 103*  BUN 32*   < > 45*  CREATININE 2.07*   < > 1.99*  CALCIUM 8.9   < > 8.0*  AST 29  --   --   ALT 19  --   --   ALKPHOS 68  --   --   BILITOT 2.4*  --   --    < > = values in this interval not displayed.    Cardiac Enzymes No results for input(s): TROPONINI in the last 168 hours.  Microbiology Results  Results for orders placed or performed during the hospital encounter of 07/29/19  Urine culture     Status: Abnormal   Collection Time: 07/29/19  2:16 PM   Specimen: In/Out Cath Urine  Result Value Ref Range Status   Specimen Description   Final    IN/OUT CATH URINE Performed at [facility],  [address] [place] [address], [place], [place] [address]    Special Requests   Final    NONE Performed at [facility], [address] [place], [place] [address]    Culture >=100,000 COLONIES/mL ESCHERICHIA COLI (A)  Final   Report Status 08/01/2019 FINAL  Final   Organism ID, Bacteria ESCHERICHIA COLI (A)  Final      Susceptibility   Escherichia coli - MIC*    AMPICILLIN 8 SENSITIVE  Sensitive     CEFAZOLIN <=4 SENSITIVE Sensitive     CEFTRIAXONE <=1 SENSITIVE Sensitive     CIPROFLOXACIN <=0.25 SENSITIVE Sensitive     GENTAMICIN <=1 SENSITIVE Sensitive     IMIPENEM <=0.25 SENSITIVE Sensitive     NITROFURANTOIN <=16 SENSITIVE Sensitive     TRIMETH/SULFA <=20 SENSITIVE Sensitive     AMPICILLIN/SULBACTAM 4 SENSITIVE Sensitive     PIP/TAZO <=4 SENSITIVE Sensitive     Extended ESBL NEGATIVE Sensitive     * >=100,000 COLONIES/mL ESCHERICHIA COLI  Blood Culture (routine x 2)     Status: Abnormal   Collection Time: 07/29/19  2:47 PM   Specimen: BLOOD  Result Value Ref Range Status   Specimen Description   Final    BLOOD RIGHT ANTECUBITAL Performed at [facility], [address], [place], [place] [address]    Special Requests   Final    BOTTLES DRAWN AEROBIC AND ANAEROBIC Blood Culture adequate volume Performed at [facility], [address], [place], [place] [address]    Culture  Setup Time   Final    GRAM NEGATIVE RODS AEROBIC BOTTLE ONLY CRITICAL VALUE NOTED.  VALUE IS CONSISTENT WITH PREVIOUSLY REPORTED AND CALLED VALUE. Performed at [facility], [address] [place], [place] [address]    Culture (A)  Final    ESCHERICHIA COLI SUSCEPTIBILITIES PERFORMED ON PREVIOUS CULTURE WITHIN THE LAST 5 DAYS. Performed at [facility], [place] [address] [place], [place] [address]    Report Status 08/01/2019 FINAL  Final  Blood Culture (routine x 2)     Status: Abnormal   Collection Time: 07/29/19  2:47 PM   Specimen: BLOOD  Result Value Ref Range  Status   Specimen Description   Final    BLOOD BLOOD RIGHT FOREARM Performed at [facility], [address] [place], [place] [address]    Special Requests   Final    BOTTLES DRAWN AEROBIC AND ANAEROBIC Blood Culture results may not be optimal due to an excessive volume of blood received in culture bottles Performed at [facility], [address], [place], [place] [address]    Culture  Setup Time   Final    GRAM NEGATIVE RODS AEROBIC BOTTLE ONLY CRITICAL RESULT CALLED TO, READ BACK BY AND VERIFIED WITH: SCOTT HALL AT 0501 07/30/2019 SDR Performed at [facility], [place] [address], [place], [place] [address]    Culture ESCHERICHIA COLI (A)  Final   Report Status 08/01/2019 FINAL  Final   Organism ID, Bacteria ESCHERICHIA COLI  Final      Susceptibility   Escherichia coli - MIC*    AMPICILLIN 8 SENSITIVE Sensitive     CEFAZOLIN <=4 SENSITIVE Sensitive     CEFEPIME <=1 SENSITIVE Sensitive     CEFTAZIDIME <=1 SENSITIVE Sensitive     CEFTRIAXONE <=1 SENSITIVE Sensitive     CIPROFLOXACIN <=0.25 SENSITIVE Sensitive     GENTAMICIN <=1 SENSITIVE Sensitive     IMIPENEM <=0.25 SENSITIVE Sensitive     TRIMETH/SULFA <=20 SENSITIVE Sensitive     AMPICILLIN/SULBACTAM <=2 SENSITIVE Sensitive     PIP/TAZO <=4 SENSITIVE Sensitive     Extended ESBL NEGATIVE Sensitive     * ESCHERICHIA COLI  Blood Culture ID Panel (Reflexed)     Status: Abnormal   Collection Time: 07/29/19  2:47 PM  Result Value Ref Range Status   Enterococcus species NOT DETECTED NOT DETECTED Final   Listeria monocytogenes NOT DETECTED NOT DETECTED Final  Staphylococcus species NOT DETECTED NOT DETECTED Final   Staphylococcus aureus (BCID) NOT DETECTED NOT DETECTED Final   Streptococcus species NOT DETECTED NOT DETECTED Final   Streptococcus agalactiae NOT DETECTED NOT DETECTED Final   Streptococcus pneumoniae NOT DETECTED NOT DETECTED Final   Streptococcus pyogenes NOT DETECTED NOT DETECTED Final    Acinetobacter baumannii NOT DETECTED NOT DETECTED Final   Enterobacteriaceae species DETECTED (A) NOT DETECTED Final    Comment: Enterobacteriaceae represent a large family of gram-negative bacteria, not a single organism. CRITICAL RESULT CALLED TO, READ BACK BY AND VERIFIED WITH:  SCOTT HALL AT 0501 07/30/2019 SDR    Enterobacter cloacae complex NOT DETECTED NOT DETECTED Final   Escherichia coli DETECTED (A) NOT DETECTED Final    Comment: CRITICAL RESULT CALLED TO, READ BACK BY AND VERIFIED WITH:  SCOTT HALL AT 0501 07/30/2019 SDR    Klebsiella oxytoca NOT DETECTED NOT DETECTED Final   Klebsiella pneumoniae NOT DETECTED NOT DETECTED Final   Proteus species NOT DETECTED NOT DETECTED Final   Serratia marcescens NOT DETECTED NOT DETECTED Final   Carbapenem resistance NOT DETECTED NOT DETECTED Final   Haemophilus influenzae NOT DETECTED NOT DETECTED Final   Neisseria meningitidis NOT DETECTED NOT DETECTED Final   Pseudomonas aeruginosa NOT DETECTED NOT DETECTED Final   Candida albicans NOT DETECTED NOT DETECTED Final   Candida glabrata NOT DETECTED NOT DETECTED Final   Candida krusei NOT DETECTED NOT DETECTED Final   Candida parapsilosis NOT DETECTED NOT DETECTED Final   Candida tropicalis NOT DETECTED NOT DETECTED Final    Comment: Performed at [facility], [address] [place], [place] [address]  SARS Coronavirus 2 [facility] order, Performed in [facility] hospital lab) Nasopharyngeal Nasopharyngeal Swab     Status: None   Collection Time: 07/29/19  4:35 PM   Specimen: Nasopharyngeal Swab  Result Value Ref Range Status   SARS Coronavirus 2 NEGATIVE NEGATIVE Final    Comment: (NOTE) If result is NEGATIVE SARS-CoV-2 target nucleic acids are NOT DETECTED. The SARS-CoV-2 RNA is generally detectable in upper and lower  respiratory specimens during the acute phase of infection. The lowest  concentration of SARS-CoV-2 viral copies this assay can detect is 250  copies / mL. A  negative result does not preclude SARS-CoV-2 infection  and should not be used as the sole basis for treatment or other  patient management decisions.  A negative result may occur with  improper specimen collection / handling, submission of specimen other  than nasopharyngeal swab, presence of viral mutation(s) within the  areas targeted by this assay, and inadequate number of viral copies  (<250 copies / mL). A negative result must be combined with clinical  observations, patient history, and epidemiological information. If result is POSITIVE SARS-CoV-2 target nucleic acids are DETECTED. The SARS-CoV-2 RNA is generally detectable in upper and lower  respiratory specimens dur ing the acute phase of infection.  Positive  results are indicative of active infection with SARS-CoV-2.  Clinical  correlation with patient history and other diagnostic information is  necessary to determine patient infection status.  Positive results do  not rule out bacterial infection or co-infection with other viruses. If result is PRESUMPTIVE POSTIVE SARS-CoV-2 nucleic acids MAY BE PRESENT.   A presumptive positive result was obtained on the submitted specimen  and confirmed on repeat testing.  While 2019 novel coronavirus  (SARS-CoV-2) nucleic acids may be present in the submitted sample  additional confirmatory testing may be necessary for epidemiological  and / or  clinical management purposes  to differentiate between  SARS-CoV-2 and other Sarbecovirus currently known to infect humans.  If clinically indicated additional testing with an alternate test  methodology [phone]) is advised. The SARS-CoV-2 RNA is generally  detectable in upper and lower respiratory sp ecimens during the acute  phase of infection. The expected result is Negative. Fact Sheet for Patients:  StrictlyIdeas.no Fact Sheet for Healthcare Providers: BankingDealers.co.za This test is not  yet approved or cleared by the [place] FDA and has been authorized for detection and/or diagnosis of SARS-CoV-2 by FDA under an Emergency Use Authorization (EUA).  This EUA will remain in effect (meaning this test can be used) for the duration of the COVID-19 declaration under Section 564(b)(1) of the Act, 21 U.S.C. section 360bbb-3(b)(1), unless the authorization is terminated or revoked sooner. Performed at [facility], [address], [place], [place] [address]     RADIOLOGY:  No results found.  Follow up with PCP in 1 week.  Management plans discussed with the patient, family and they are in agreement.  CODE STATUS: DNR    Code Status Orders  (From admission, onward)         Start     Ordered   07/29/19 2011  Do not attempt resuscitation (DNR)  Continuous    Question Answer Comment  In the event of cardiac or respiratory ARREST Do not call a "code blue"   In the event of cardiac or respiratory ARREST Do not perform Intubation, CPR, defibrillation or ACLS   In the event of cardiac or respiratory ARREST Use medication by any route, position, wound care, and other measures to relive pain and suffering. May use oxygen, suction and manual treatment of airway obstruction as needed for comfort.      07/29/19 2010        Code Status History    This patient has a current code status but no historical code status.   Advance Care Planning Activity      TOTAL TIME TAKING CARE OF THIS PATIENT ON DAY OF DISCHARGE: more than 35 minutes.   Saundra Shelling M.D on 08/01/2019 at 10:48 AM  Between 7am to 6pm - Pager - [phone]  After 6pm go to [email or web address] - password [email or web address] [facility]  Office  [phone]  CC: Primary care physician; Dion Body, MD  Note: This dictation was prepared with Dragon dictation along with smaller phrase technology. Any transcriptional errors that result from this process are unintentional.

## 2019-08-12 DIAGNOSIS — C44311 Basal cell carcinoma of skin of nose: Secondary | ICD-10-CM | POA: Insufficient documentation

## 2019-09-23 DIAGNOSIS — Z8546 Personal history of malignant neoplasm of prostate: Secondary | ICD-10-CM | POA: Insufficient documentation

## 2019-09-23 DIAGNOSIS — D519 Vitamin B12 deficiency anemia, unspecified: Secondary | ICD-10-CM | POA: Insufficient documentation

## 2020-03-31 DIAGNOSIS — Z7901 Long term (current) use of anticoagulants: Secondary | ICD-10-CM | POA: Insufficient documentation

## 2020-05-05 ENCOUNTER — Other Ambulatory Visit: Payer: Self-pay | Admitting: Family Medicine

## 2020-05-05 DIAGNOSIS — R9389 Abnormal findings on diagnostic imaging of other specified body structures: Secondary | ICD-10-CM

## 2020-05-05 DIAGNOSIS — J984 Other disorders of lung: Secondary | ICD-10-CM

## 2020-05-13 ENCOUNTER — Other Ambulatory Visit: Payer: Self-pay

## 2020-05-13 ENCOUNTER — Ambulatory Visit
Admission: RE | Admit: 2020-05-13 | Discharge: 2020-05-13 | Disposition: A | Discharge status: Home / Self Care | Payer: Medicare Other | Source: Ambulatory Visit | Attending: Family Medicine | Admitting: Family Medicine

## 2020-05-13 DIAGNOSIS — J984 Other disorders of lung: Secondary | ICD-10-CM | POA: Diagnosis present

## 2020-05-13 DIAGNOSIS — R9389 Abnormal findings on diagnostic imaging of other specified body structures: Secondary | ICD-10-CM | POA: Diagnosis not present

## 2020-08-03 DIAGNOSIS — N1832 Chronic kidney disease, stage 3b: Secondary | ICD-10-CM | POA: Diagnosis present

## 2020-08-03 DIAGNOSIS — I509 Heart failure, unspecified: Secondary | ICD-10-CM | POA: Insufficient documentation

## 2020-08-03 DIAGNOSIS — N189 Chronic kidney disease, unspecified: Secondary | ICD-10-CM | POA: Insufficient documentation

## 2020-08-03 DIAGNOSIS — I129 Hypertensive chronic kidney disease with stage 1 through stage 4 chronic kidney disease, or unspecified chronic kidney disease: Secondary | ICD-10-CM | POA: Insufficient documentation

## 2020-08-04 ENCOUNTER — Other Ambulatory Visit: Payer: Self-pay | Admitting: Nephrology

## 2020-08-04 DIAGNOSIS — N1832 Chronic kidney disease, stage 3b: Secondary | ICD-10-CM

## 2020-08-04 DIAGNOSIS — N184 Chronic kidney disease, stage 4 (severe): Secondary | ICD-10-CM

## 2020-08-04 DIAGNOSIS — D631 Anemia in chronic kidney disease: Secondary | ICD-10-CM

## 2020-08-04 DIAGNOSIS — I509 Heart failure, unspecified: Secondary | ICD-10-CM

## 2020-08-11 ENCOUNTER — Other Ambulatory Visit: Payer: Self-pay

## 2020-08-11 ENCOUNTER — Ambulatory Visit
Admission: RE | Admit: 2020-08-11 | Discharge: 2020-08-11 | Disposition: A | Discharge status: Home / Self Care | Payer: Medicare Other | Source: Ambulatory Visit | Attending: Nephrology | Admitting: Nephrology

## 2020-08-11 DIAGNOSIS — I129 Hypertensive chronic kidney disease with stage 1 through stage 4 chronic kidney disease, or unspecified chronic kidney disease: Secondary | ICD-10-CM | POA: Insufficient documentation

## 2020-08-11 DIAGNOSIS — N1832 Chronic kidney disease, stage 3b: Secondary | ICD-10-CM | POA: Diagnosis present

## 2020-08-11 DIAGNOSIS — D631 Anemia in chronic kidney disease: Secondary | ICD-10-CM | POA: Insufficient documentation

## 2020-08-11 DIAGNOSIS — N184 Chronic kidney disease, stage 4 (severe): Secondary | ICD-10-CM | POA: Diagnosis present

## 2020-08-11 DIAGNOSIS — I509 Heart failure, unspecified: Secondary | ICD-10-CM | POA: Insufficient documentation

## 2020-09-14 DIAGNOSIS — N2581 Secondary hyperparathyroidism of renal origin: Secondary | ICD-10-CM | POA: Insufficient documentation

## 2021-04-23 IMAGING — DX PORTABLE CHEST - 1 VIEW
1 series · 1 of 1 positions shown · non-contrast
Comparison: 09/17/2017

CLINICAL DATA: 87-year-old male with a history of chest pain

EXAM:
PORTABLE CHEST 1 VIEW

[chest ap]
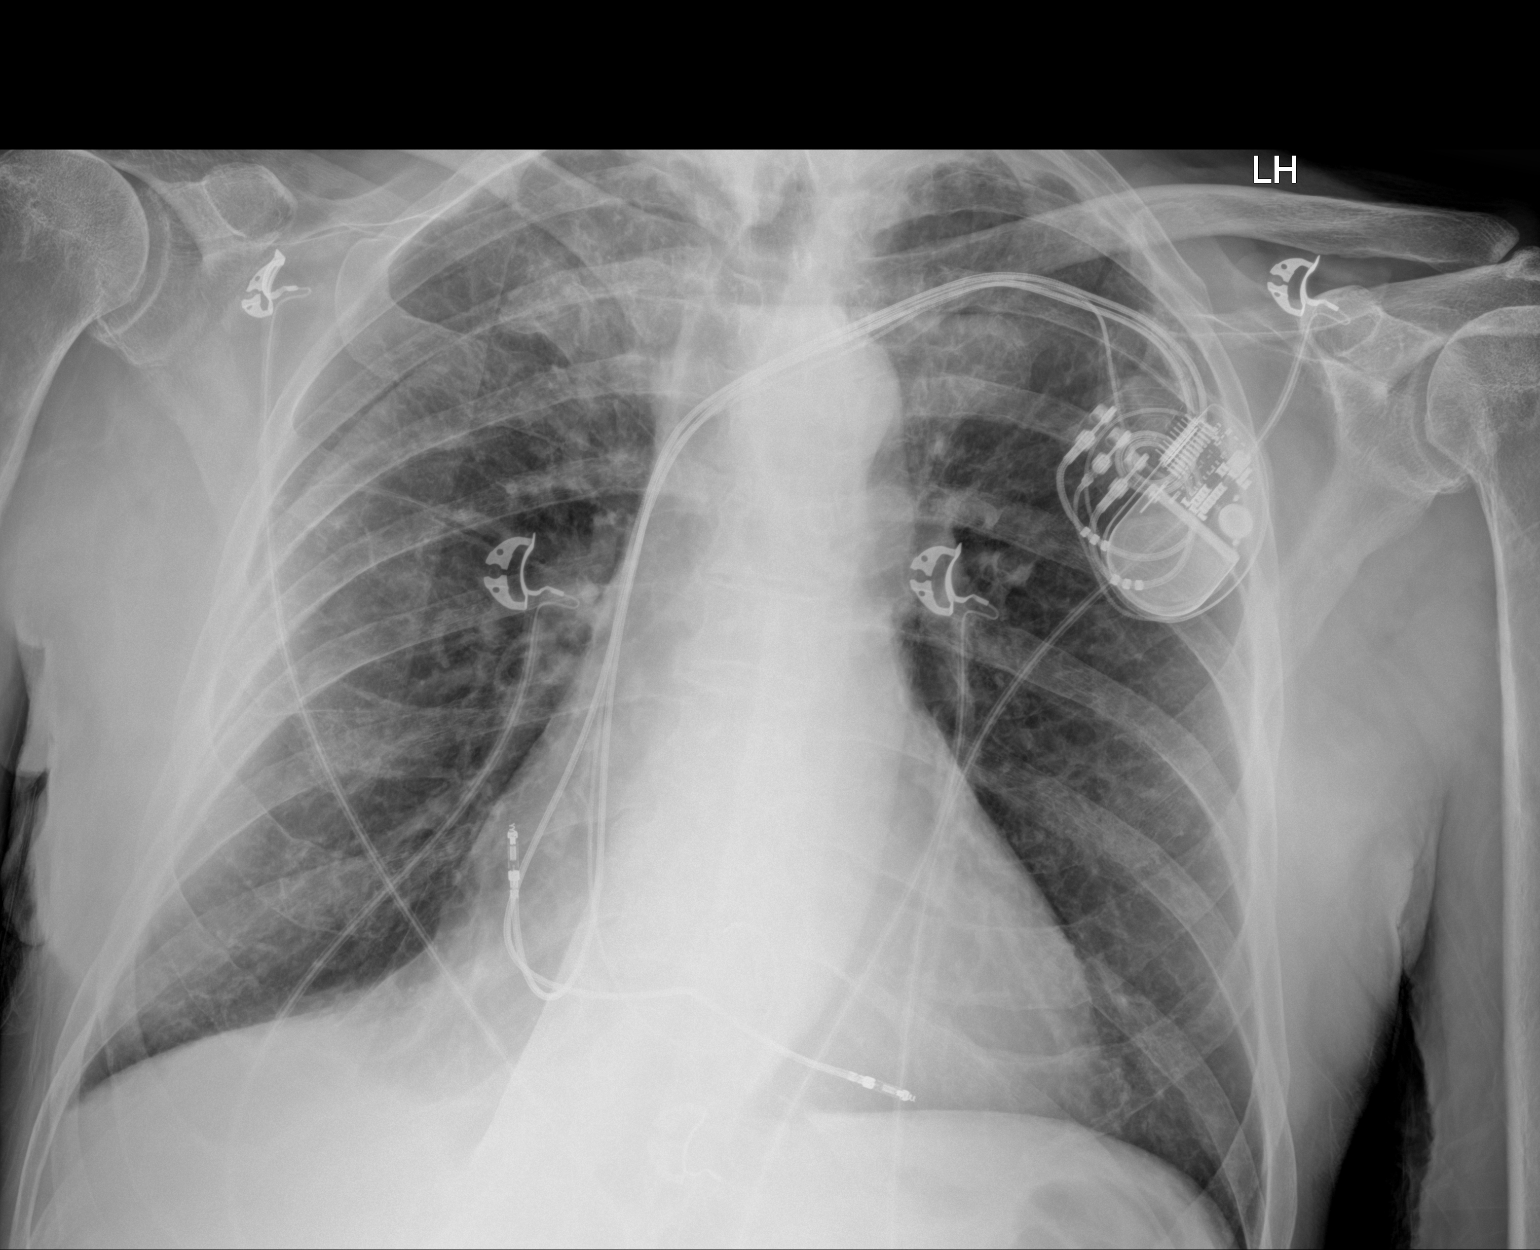

[1 of 1 positions shown; findings below may reference images not displayed]

FINDINGS: Cardiomediastinal silhouette unchanged in size and contour. Left
chest wall cardiac pacing device is unchanged.

No interlobular septal thickening. Coarsened interstitial markings
similar to the prior. No confluent airspace disease.
Pleuroparenchymal thickening is unchanged.

No displaced fracture.  No pleural effusion or pneumothorax.
IMPRESSION: Chronic lung changes without evidence of acute cardiopulmonary
disease.

Unchanged cardiac pacing device.

## 2021-06-05 IMAGING — CT CT HEAD WITHOUT CONTRAST
3 series · 15 of 47 positions shown, 18 images · non-contrast
Comparison: None.

CLINICAL DATA: Dizziness, double vision

EXAM:
CT HEAD WITHOUT CONTRAST
TECHNIQUE: Contiguous axial images were obtained from the base of the skull
through the vertex without intravenous contrast.

[Series 3: head wo · axial · 0.40mm/px · z∈[-135,-10]mm · 9 of 31 slices shown, 12 images]
[im 3/31  brain]
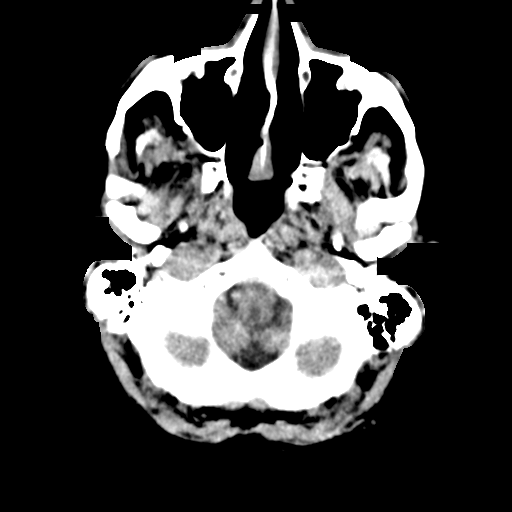
[im 3/31  bone]
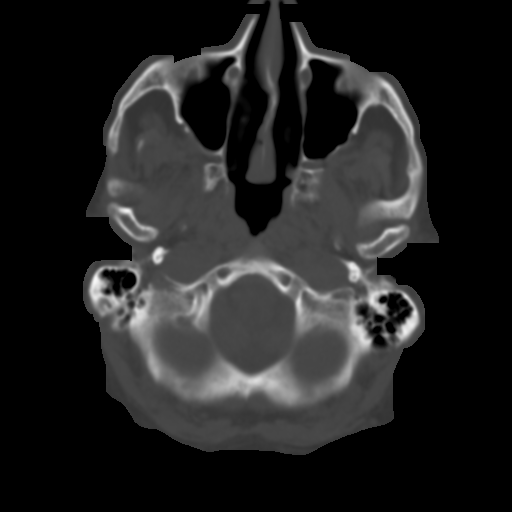
[im 6/31  brain]
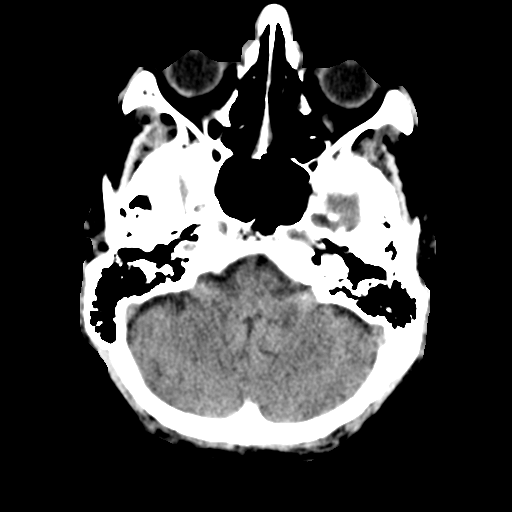
[im 9/31  brain]
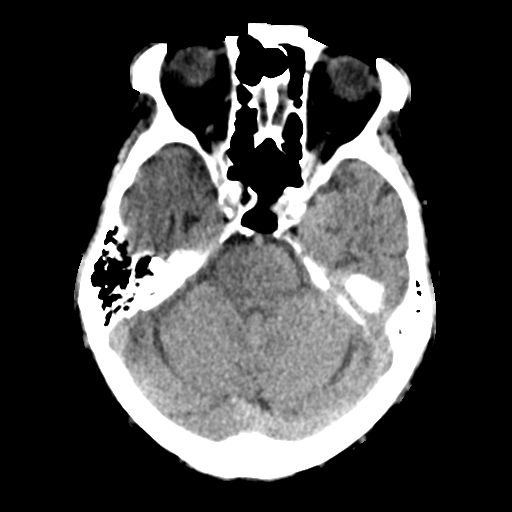
[im 12/31  brain]
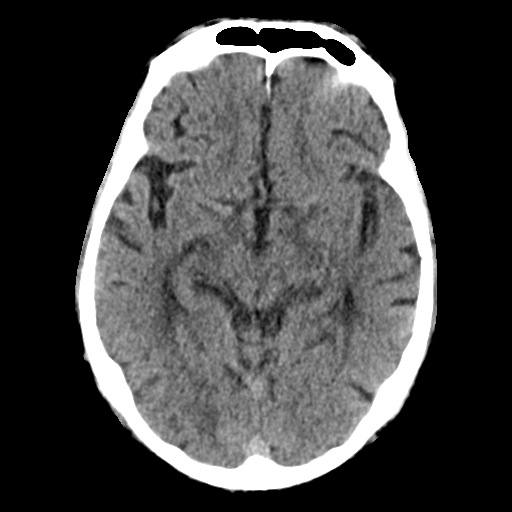
[im 16/31  brain]
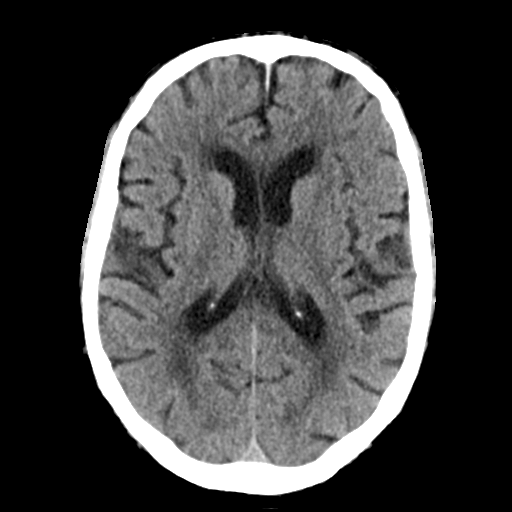
[im 16/31  bone]
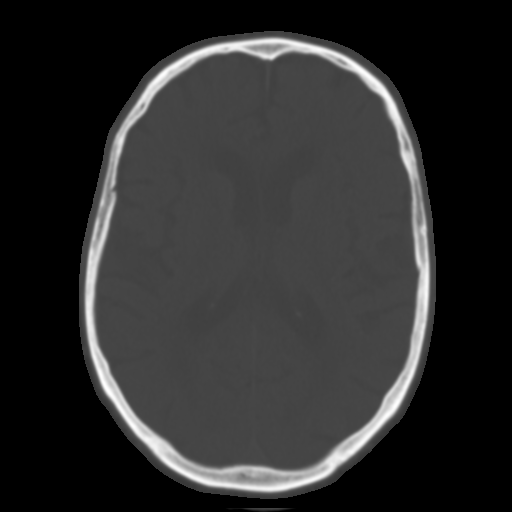
[im 19/31  brain]
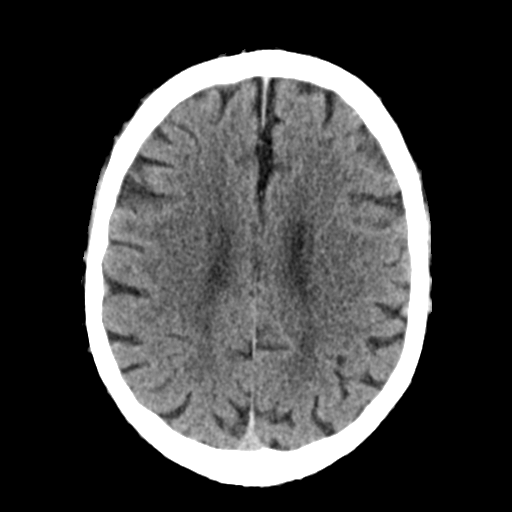
[im 22/31  brain]
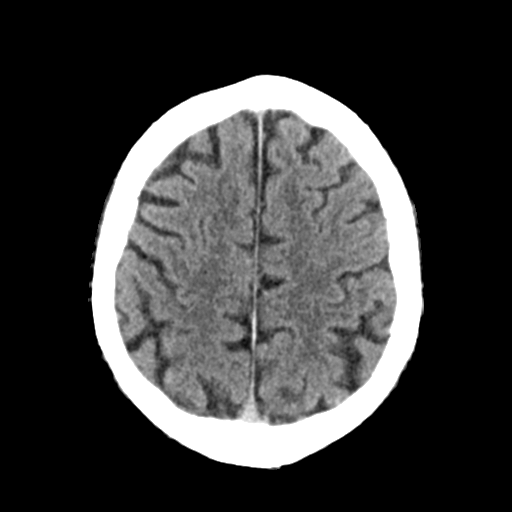
[im 25/31  brain]
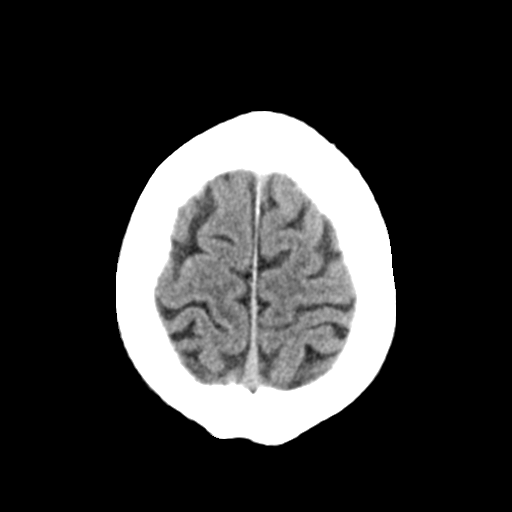
[im 28/31  brain]
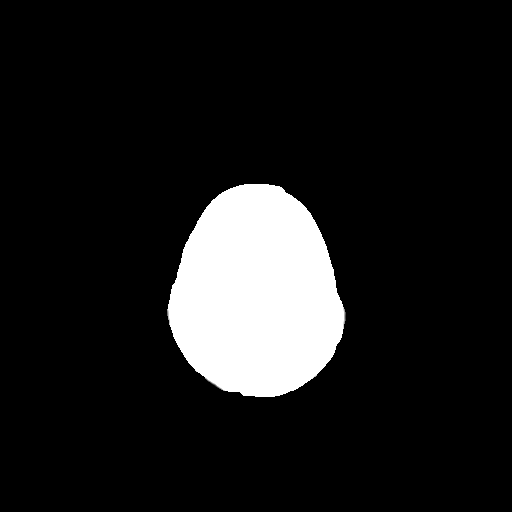
[im 28/31  bone]
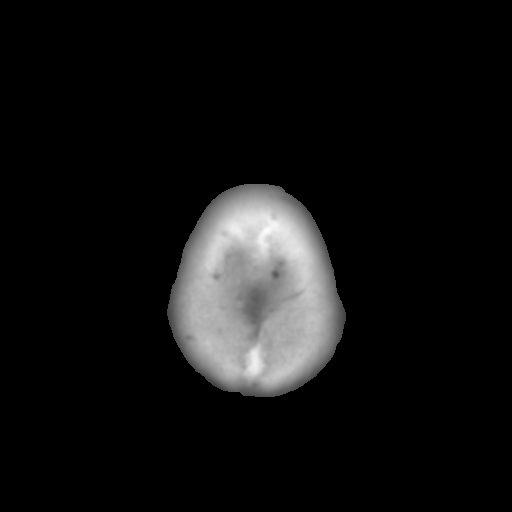

[Series 4: coronal soft tissue · coronal · 0.28mm/px · 3 of 68 slices shown]
[im 23/68  brain]
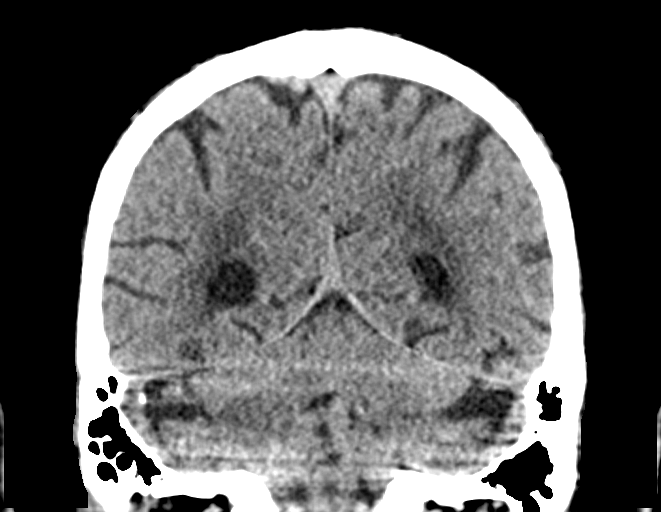
[im 30/68  brain]
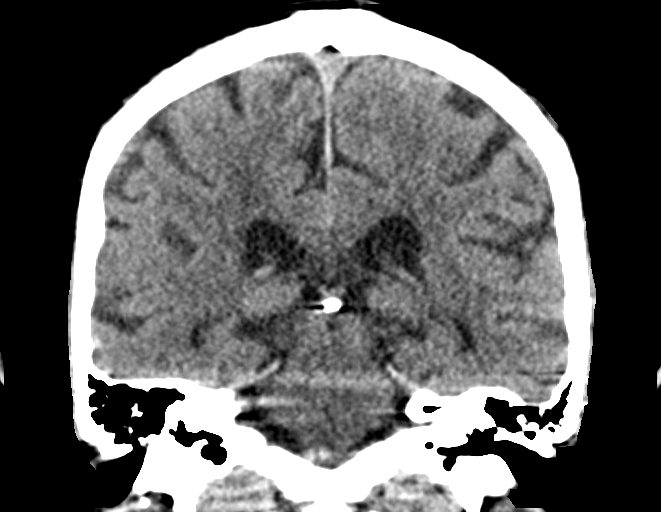
[im 38/68  brain]
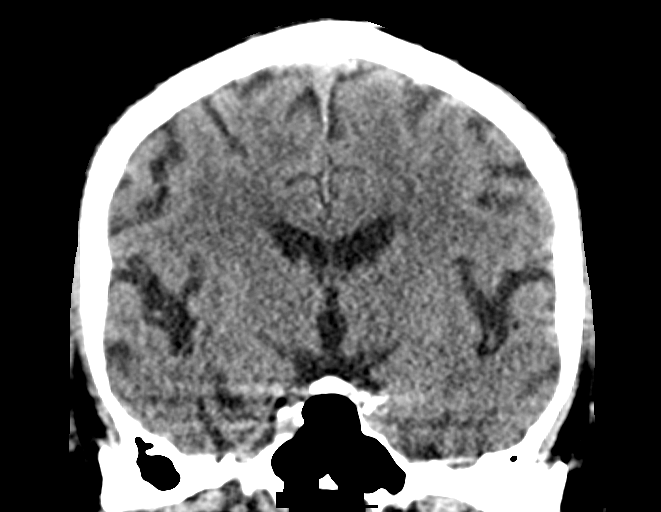

[Series 5: sagittal soft tissue · sagittal · 0.32mm/px · 3 of 54 slices shown]
[im 18/54  brain]
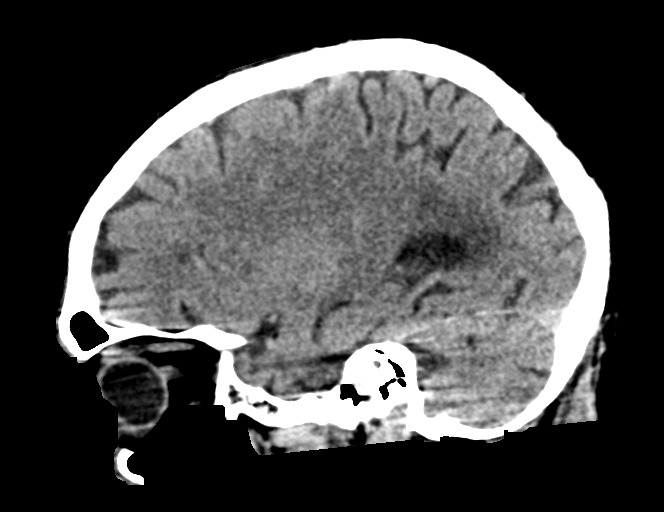
[im 27/54  brain]
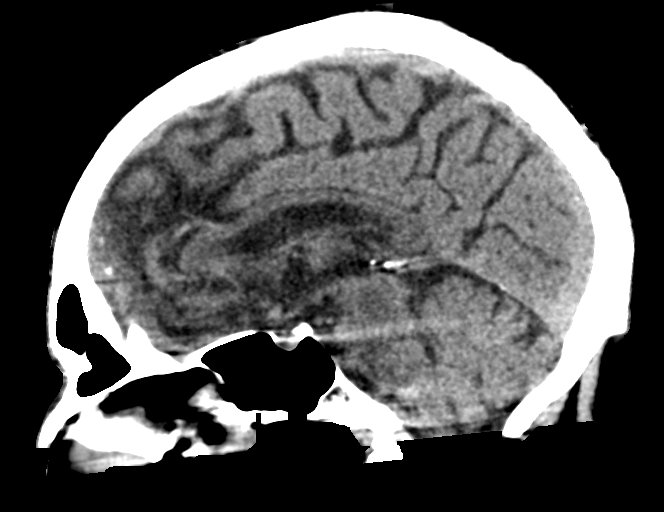
[im 36/54  brain]
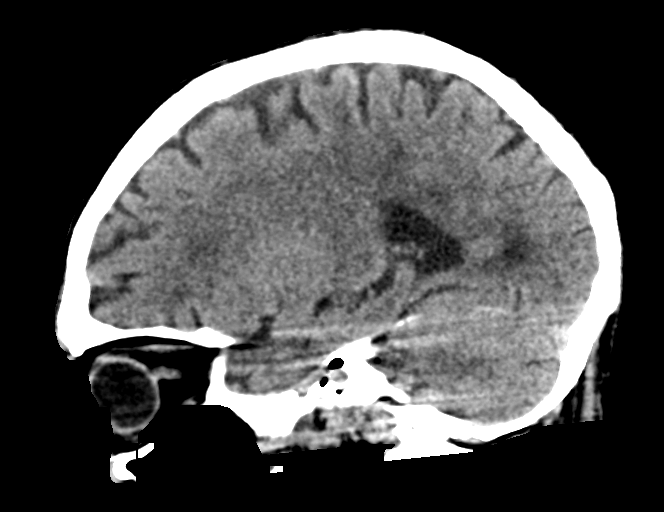

[15 of 47 positions shown; findings below may reference images not displayed]

FINDINGS: Brain: No evidence of acute infarction, hemorrhage, hydrocephalus,
extra-axial collection or mass lesion/mass effect. Periventricular
white matter hypodensity.

Vascular: No hyperdense vessel or unexpected calcification.

Skull: Normal. Negative for fracture or focal lesion.

Sinuses/Orbits: No acute finding.

Other: None.
IMPRESSION: No acute intracranial pathology.  Small-vessel white matter disease.

## 2021-12-24 DIAGNOSIS — I442 Atrioventricular block, complete: Secondary | ICD-10-CM | POA: Insufficient documentation

## 2023-06-15 ENCOUNTER — Inpatient Hospital Stay
Admission: EM | Admit: 2023-06-15 | Discharge: 2023-06-19 | DRG: 291 | Disposition: A | Discharge status: Home Health | Payer: Medicare Other | Source: Ambulatory Visit | Attending: Internal Medicine | Admitting: Internal Medicine

## 2023-06-15 ENCOUNTER — Emergency Department: Payer: Medicare Other

## 2023-06-15 ENCOUNTER — Other Ambulatory Visit: Payer: Self-pay

## 2023-06-15 ENCOUNTER — Encounter: Payer: Self-pay | Admitting: Radiology

## 2023-06-15 DIAGNOSIS — E785 Hyperlipidemia, unspecified: Secondary | ICD-10-CM | POA: Diagnosis present

## 2023-06-15 DIAGNOSIS — R5381 Other malaise: Secondary | ICD-10-CM

## 2023-06-15 DIAGNOSIS — Z1152 Encounter for screening for COVID-19: Secondary | ICD-10-CM

## 2023-06-15 DIAGNOSIS — Z79890 Hormone replacement therapy (postmenopausal): Secondary | ICD-10-CM

## 2023-06-15 DIAGNOSIS — Z6828 Body mass index (BMI) 28.0-28.9, adult: Secondary | ICD-10-CM

## 2023-06-15 DIAGNOSIS — Z79899 Other long term (current) drug therapy: Secondary | ICD-10-CM

## 2023-06-15 DIAGNOSIS — Z66 Do not resuscitate: Secondary | ICD-10-CM | POA: Diagnosis present

## 2023-06-15 DIAGNOSIS — I10 Essential (primary) hypertension: Secondary | ICD-10-CM | POA: Diagnosis not present

## 2023-06-15 DIAGNOSIS — I130 Hypertensive heart and chronic kidney disease with heart failure and stage 1 through stage 4 chronic kidney disease, or unspecified chronic kidney disease: Secondary | ICD-10-CM | POA: Diagnosis not present

## 2023-06-15 DIAGNOSIS — I429 Cardiomyopathy, unspecified: Secondary | ICD-10-CM | POA: Diagnosis present

## 2023-06-15 DIAGNOSIS — I5023 Acute on chronic systolic (congestive) heart failure: Secondary | ICD-10-CM | POA: Diagnosis present

## 2023-06-15 DIAGNOSIS — E43 Unspecified severe protein-calorie malnutrition: Secondary | ICD-10-CM | POA: Insufficient documentation

## 2023-06-15 DIAGNOSIS — Z7901 Long term (current) use of anticoagulants: Secondary | ICD-10-CM

## 2023-06-15 DIAGNOSIS — Z950 Presence of cardiac pacemaker: Secondary | ICD-10-CM

## 2023-06-15 DIAGNOSIS — M4800 Spinal stenosis, site unspecified: Secondary | ICD-10-CM | POA: Insufficient documentation

## 2023-06-15 DIAGNOSIS — N1832 Chronic kidney disease, stage 3b: Secondary | ICD-10-CM | POA: Diagnosis present

## 2023-06-15 DIAGNOSIS — R0602 Shortness of breath: Secondary | ICD-10-CM | POA: Diagnosis not present

## 2023-06-15 DIAGNOSIS — I4891 Unspecified atrial fibrillation: Secondary | ICD-10-CM

## 2023-06-15 DIAGNOSIS — Z8249 Family history of ischemic heart disease and other diseases of the circulatory system: Secondary | ICD-10-CM

## 2023-06-15 DIAGNOSIS — E039 Hypothyroidism, unspecified: Secondary | ICD-10-CM | POA: Diagnosis present

## 2023-06-15 DIAGNOSIS — R634 Abnormal weight loss: Secondary | ICD-10-CM

## 2023-06-15 DIAGNOSIS — I2510 Atherosclerotic heart disease of native coronary artery without angina pectoris: Secondary | ICD-10-CM | POA: Diagnosis present

## 2023-06-15 LAB — BRAIN NATRIURETIC PEPTIDE: B Natriuretic Peptide: 641.3 pg/mL — ABNORMAL HIGH (ref 0.0–100.0)

## 2023-06-15 LAB — HEPATIC FUNCTION PANEL
ALT: 16 U/L (ref 0–44)
AST: 30 U/L (ref 15–41)
Albumin: 3.2 g/dL — ABNORMAL LOW (ref 3.5–5.0)
Alkaline Phosphatase: 101 U/L (ref 38–126)
Bilirubin, Direct: 0.3 mg/dL — ABNORMAL HIGH (ref 0.0–0.2)
Indirect Bilirubin: 0.8 mg/dL (ref 0.3–0.9)
Total Bilirubin: 1.1 mg/dL (ref 0.3–1.2)
Total Protein: 7 g/dL (ref 6.5–8.1)

## 2023-06-15 LAB — CBC
HCT: 44.3 % (ref 39.0–52.0)
Hemoglobin: 14.2 g/dL (ref 13.0–17.0)
MCH: 29.9 pg (ref 26.0–34.0)
MCHC: 32.1 g/dL (ref 30.0–36.0)
MCV: 93.3 fL (ref 80.0–100.0)
Platelets: 299 10*3/uL (ref 150–400)
RBC: 4.75 MIL/uL (ref 4.22–5.81)
RDW: 13.2 % (ref 11.5–15.5)
WBC: 6.4 10*3/uL (ref 4.0–10.5)
nRBC: 0 % (ref 0.0–0.2)

## 2023-06-15 LAB — BASIC METABOLIC PANEL
Anion gap: 6 (ref 5–15)
BUN: 25 mg/dL — ABNORMAL HIGH (ref 8–23)
CO2: 32 mmol/L (ref 22–32)
Calcium: 8.6 mg/dL — ABNORMAL LOW (ref 8.9–10.3)
Chloride: 98 mmol/L (ref 98–111)
Creatinine, Ser: 1.71 mg/dL — ABNORMAL HIGH (ref 0.61–1.24)
GFR, Estimated: 38 mL/min — ABNORMAL LOW (ref >60)
Glucose, Bld: 118 mg/dL — ABNORMAL HIGH (ref 70–99)
Potassium: 3.6 mmol/L (ref 3.5–5.1)
Sodium: 136 mmol/L (ref 135–145)

## 2023-06-15 LAB — SARS CORONAVIRUS 2 BY RT PCR: SARS Coronavirus 2 by RT PCR: NEGATIVE

## 2023-06-15 LAB — TROPONIN I (HIGH SENSITIVITY)
Troponin I (High Sensitivity): 155 ng/L (ref <18)
Troponin I (High Sensitivity): 149 ng/L (ref <18)

## 2023-06-15 LAB — PROCALCITONIN: Procalcitonin: <0.1 ng/mL

## 2023-06-15 MED ORDER — CARVEDILOL 3.125 MG PO TABS
3.1250 mg | ORAL_TABLET | Freq: Two times a day (BID) | ORAL | Status: DC
  Administered 2023-06-16 – 2023-06-19 (×6): 3.125 mg via ORAL
  Filled 2023-06-15 (×6): qty 1

## 2023-06-15 MED ORDER — FUROSEMIDE 10 MG/ML IJ SOLN
40.0000 mg | Freq: Once | INTRAMUSCULAR | Status: AC
  Administered 2023-06-15: 40 mg via INTRAVENOUS
  Filled 2023-06-15: qty 4

## 2023-06-15 MED ORDER — SODIUM CHLORIDE 0.9 % IV SOLN: 250.0000 mL | INTRAVENOUS | Status: DC | PRN

## 2023-06-15 MED ORDER — ASPIRIN 325 MG PO TABS
325.0000 mg | ORAL_TABLET | Freq: Once | ORAL | Status: AC
  Administered 2023-06-15: 325 mg via ORAL
  Filled 2023-06-15: qty 1

## 2023-06-15 MED ORDER — ONDANSETRON HCL 4 MG/2ML IJ SOLN: 4.0000 mg | Freq: Four times a day (QID) | INTRAMUSCULAR | Status: DC | PRN

## 2023-06-15 MED ORDER — IOHEXOL 350 MG/ML SOLN
60.0000 mL | Freq: Once | INTRAVENOUS | Status: AC | PRN
  Administered 2023-06-15: 60 mL via INTRAVENOUS

## 2023-06-15 MED ORDER — SODIUM CHLORIDE 0.9% FLUSH
3.0000 mL | Freq: Two times a day (BID) | INTRAVENOUS | Status: DC
  Administered 2023-06-15 – 2023-06-19 (×8): 3 mL via INTRAVENOUS

## 2023-06-15 MED ORDER — ENSURE ENLIVE PO LIQD
237.0000 mL | Freq: Two times a day (BID) | ORAL | Status: DC
  Administered 2023-06-16: 237 mL via ORAL

## 2023-06-15 MED ORDER — LEVOTHYROXINE SODIUM 100 MCG PO TABS
100.0000 ug | ORAL_TABLET | Freq: Every day | ORAL | Status: DC
  Administered 2023-06-16 – 2023-06-19 (×4): 100 ug via ORAL
  Filled 2023-06-15 (×4): qty 1

## 2023-06-15 MED ORDER — ACETAMINOPHEN 325 MG PO TABS: 650.0000 mg | ORAL_TABLET | ORAL | Status: DC | PRN

## 2023-06-15 MED ORDER — SODIUM CHLORIDE 0.9% FLUSH
3.0000 mL | INTRAVENOUS | Status: DC | PRN
  Administered 2023-06-15: 3 mL via INTRAVENOUS

## 2023-06-15 MED ORDER — APIXABAN 2.5 MG PO TABS
2.5000 mg | ORAL_TABLET | Freq: Two times a day (BID) | ORAL | Status: DC
  Administered 2023-06-15 – 2023-06-18 (×6): 2.5 mg via ORAL
  Filled 2023-06-15 (×7): qty 1

## 2023-06-15 MED ORDER — FUROSEMIDE 10 MG/ML IJ SOLN
40.0000 mg | Freq: Every day | INTRAMUSCULAR | Status: DC
  Administered 2023-06-16 – 2023-06-19 (×4): 40 mg via INTRAVENOUS
  Filled 2023-06-15 (×4): qty 4

## 2023-06-15 NOTE — ED Triage Notes (Signed)
Pt c/o R side CP x3 weeks with SOB. Pt went to [facility] and was sent here for further evaluation due to decreased oxygen saturation. Pt is conversationally dyspneic. Pt reports taking his diuretic as prescribed.

## 2023-06-15 NOTE — H&P (Addendum)
History and Physical    Patient: Jacob Glenn DOB: [birth year] DOA: 06/15/2023 DOS: the patient was seen and examined on 06/15/2023 PCP: Marisue Ivan, MD  Patient coming from: Home  Chief Complaint:  Chief Complaint  Patient presents with   Chest Pain   HPI: Jacob Glenn is a 89 y.o. 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  Mr. Kepner states that for the last 3 weeks, he has been experiencing significant fatigue, especially with exertion in addition to right-sided chest pain and shortness of breath.  He notes that chest pain is associated with his difficulty breathing.  He denies any substernal or left-sided chest pain.  He denies any palpitations or lower extremity swelling.  He endorses orthopnea but states it is alleviated by laying on his left side.  He has been taking Lasix daily without any missed doses recently, however states that his dose is frequently adjusted based off his kidney function.  He denies any fever, chills, nausea, vomiting.  He has had a very poor appetite essentially over the last year but it has gotten worse over the last several months and this has led to some weight loss.  He denies any abdominal distention.  ED Course:  On arrival to the ED, patient was normotensive at 111/60 with heart rate of 64.  He was saturating at 93% on room air.  He was tachypneic at 24/minute.  He was afebrile at 97.9. Initial workup notable for normal CBC, and CMP with glucose of 118, BUN 25, creatinine 1.71 with GFR of 38.  BNP elevated 641.  Troponin elevated at 155 with flat trend of 149.  CT a was obtained, that was negative for PE, however evidence of bilateral pleural effusions and edema.  Patient started on IV Lasix and aspirin.  TRH contacted for  admission.  Review of Systems: As mentioned in the history of present illness. All other systems reviewed and are negative.  Past Medical History:  Diagnosis Date   A-fib [facility])    Coronary artery disease    Hypertension    Sleep apnea    Thyroid disease    Past Surgical History:  Procedure Laterality Date   BACK SURGERY     HERNIA REPAIR     PACEMAKER IMPLANT     PROSTATE ABLATION     Social History:  reports that he has never smoked. He has never used smokeless tobacco. He reports that he does not drink alcohol and does not use drugs.  No Known Allergies  Family History  Problem Relation Age of Onset   Hypertension Mother     Prior to Admission medications   Medication Sig Start Date End Date Taking? Authorizing Provider  apixaban (ELIQUIS) 2.5 MG TABS tablet Take 2.5 mg by mouth 2 (two) times a day. 10/17/18   [provider]  carvedilol (COREG) 3.125 MG tablet Take 3.125 mg by mouth 2 (two) times daily with a meal.  10/17/18   [provider]  doxazosin (CARDURA) 4 MG tablet Take 1 tablet (4 mg total) by mouth daily. 08/01/19 08/31/19  Ihor Austin, MD  furosemide (LASIX) 40 MG tablet Take 1 tablet (40 mg total) by mouth daily. 08/03/19 09/02/19  Ihor Austin, MD  levothyroxine (SYNTHROID) 100 MCG tablet Take 100 mcg by mouth daily. 07/03/18  [provider]  losartan (COZAAR) 25 MG tablet Take 25 mg by mouth daily. 11/20/18 11/20/19  [provider]  Vitamin D, Ergocalciferol, (DRISDOL) 1.25 MG (50000 UT) CAPS capsule Take 50,000 Units by mouth every 7 (seven) days. Sunday    [provider]    Physical Exam: Vitals:   06/15/23 1547 06/15/23 1600 06/15/23 1800 06/15/23 1843  BP: 128/73 132/64 (!) 102/50   Pulse:  60 60   Resp: 19 (!) 24 (!) 22   Temp:    97.7 F (36.5 C)  TempSrc:    Oral  SpO2:  98% 98%    Physical Exam Vitals and nursing note reviewed.  Constitutional:      General: He is not in acute distress.     Appearance: He is normal weight.  HENT:     Head: Normocephalic and atraumatic.     Mouth/Throat:     Mouth: Mucous membranes are moist.     Pharynx: Oropharynx is clear.  Eyes:     Conjunctiva/sclera: Conjunctivae normal.     Pupils: Pupils are equal, round, and reactive to light.  Cardiovascular:     Rate and Rhythm: Normal rate and regular rhythm.     Heart sounds: No murmur heard.    Comments: No lower extremity swelling Pulmonary:     Effort: Pulmonary effort is normal. No tachypnea.     Breath sounds: Decreased breath sounds (Bilaterally) present. No wheezing, rhonchi or rales.  Abdominal:     General: Bowel sounds are normal. There is no distension.     Palpations: Abdomen is soft.     Tenderness: There is no abdominal tenderness.  Neurological:     General: No focal deficit present.     Mental Status: He is alert and oriented to person, place, and time.  Psychiatric:        Mood and Affect: Mood is depressed.        Speech: Speech normal.        Behavior: Behavior normal. Behavior is cooperative.        Cognition and Memory: Cognition and memory normal.    Data Reviewed: CBC with WBC of 6.4, hemoglobin 14.2, platelets of 299 CMP with sodium of 136, potassium 3.6, bicarb 30, glucose 118, BUN 25, creatinine 1.71, calcium 8.6, albumin 3.2, GFR 38 BNP 641 Troponin 155 with flat trend to 149  EKG personally reviewed.  Ventricular pacing with rate of 62.  CT Angio Chest PE W and/or Wo Contrast  Result Date: 06/15/2023 CLINICAL DATA:  Right-sided chest pain for 3 weeks with shortness of breath. Pulmonary embolism suspected, high probability. EXAM: CT ANGIOGRAPHY CHEST WITH CONTRAST TECHNIQUE: Multidetector CT imaging of the chest was performed using the standard protocol during bolus administration of intravenous contrast. Multiplanar CT image reconstructions and MIPs were obtained to evaluate the vascular anatomy. RADIATION DOSE REDUCTION: This exam was performed according  to the departmental dose-optimization program which includes automated exposure control, adjustment of the mA and/or kV according to patient size and/or use of iterative reconstruction technique. CONTRAST:  60mL OMNIPAQUE IOHEXOL 350 MG/ML SOLN COMPARISON:  Chest radiographs 06/15/2023 and 07/29/2019. Noncontrast chest CT 05/13/2020. FINDINGS: Cardiovascular: The pulmonary arteries are well opacified with contrast to the level of the subsegmental branches. There is no evidence of acute pulmonary embolism. Aortic and coronary artery atherosclerosis without evidence of acute systemic arterial abnormality. Pacemaker leads are grossly unchanged. There are probable calcifications of the aortic valve. The heart size is normal. There is no  pericardial effusion. Mediastinum/Nodes: There are no enlarged mediastinal, hilar or axillary lymph nodes.Chronic calcified right hilar lymph nodes. The thyroid gland, trachea and esophagus demonstrate no significant findings. Lungs/Pleura: New right-greater-than-left pleural effusions. No pneumothorax. Underlying centrilobular emphysema with increased central airway thickening and new patchy ground-glass opacities throughout the lungs, greatest in the right upper lobe. Chronic scarring at both lung apices. No confluent airspace disease or suspicious pulmonary nodule. Upper abdomen: There is reflux of contrast into the IVC and hepatic veins. The visualized upper abdomen otherwise appears unremarkable. Musculoskeletal/Chest wall: There is no chest wall mass or suspicious osseous finding. There is a paucity of subcutaneous and deep fat. Unless specific follow-up recommendations are mentioned in the findings or impression sections, no imaging follow-up of any mentioned incidental findings is recommended. Review of the MIP images confirms the above findings. IMPRESSION: 1. No evidence of acute pulmonary embolism or other acute vascular findings in the chest. 2. New right-greater-than-left  pleural effusions with new patchy ground-glass opacities throughout the lungs, greatest in the right upper lobe. Findings are suspicious for atypical infection and/or asymmetric pulmonary edema. 3. Aortic Atherosclerosis (ICD10-I70.0) and Emphysema (ICD10-J43.9). Electronically Signed   By: Carey Bullocks M.D.   On: 06/15/2023 17:01   DG Chest 2 View  Result Date: 06/15/2023 CLINICAL DATA:  Provided history: Shortness of breath.  Chest pain. EXAM: CHEST - 2 VIEW COMPARISON:  Report from chest radiographs 12/09/2021 (images unavailable). Chest CT 05/13/2020. FINDINGS: Left chest multilead implantable cardiac device. Heart size within normal limits. Aortic atherosclerosis. Upper lobe predominant pulmonary fibrosis. No appreciable airspace consolidation. No definite pleural effusion or evidence of pneumothorax. No acute osseous abnormality identified. Scoliosis. IMPRESSION: 1. No evidence of an acute cardiopulmonary abnormality. 2. Upper lobe predominant pulmonary fibrosis. 3. Aortic Atherosclerosis (ICD10-I70.0). Electronically Signed   By: Jackey Loge D.O.   On: 06/15/2023 14:52    Results are pending, will review when available.  Assessment and Plan:  * Acute on chronic systolic CHF (congestive heart failure) (HCC) Per chart review, patient has a history of congestive heart failure, initially due to RV pacing induced cardiomyopathy s/p CRT-D placement.  Most recent echocardiogram in January 2023 demonstrated LV EF of 45% with moderate LVH and diffusely hypocontractile.  He seems to have had multiple increases in the reductions in Lasix, most recently in April when his Lasix was reduced to once daily.  Given prolonged onset of 3 weeks, pulmonary edema seen on CT and elevated BNP, current presentation is most likely acute exacerbation.  Low suspicion for active infection.  - Telemetry monitoring - Echocardiogram ordered - Lasix 40 mg IV daily - Strict in and out - Daily weights - Monitor potassium  and magnesium  Physical deconditioning Patient states he has become gradually less functional, stating that he gets very winded and fatigued with any exertion.  This has led him to become mostly sedentary.  I suspect that in addition to CHF, physical deconditioning is playing a large role.  - PT/OT  Chronic kidney disease, stage 3b (HCC) Patient has a history of CKD stage IIIb with most recent creatinine ranging between 1.8 and 2.1.  Renal function currently at baseline.  - Monitor renal function while IV diuresing - Avoid other nephrotoxic agents as able  Essential hypertension Patient's blood pressure is on the lower end of normal given diuresis.  Due to this, will hold losartan but continue carvedilol.  - Continue home carvedilol  Atrial fibrillation (HCC) - Continue home carvedilol and apixaban  Weight loss Patient  reports a poor appetite for at least 1 year now, but notes he has been losing weight gradually due to this.  He notes that eating used to bring him joy but no longer does so.  He also does not enjoy fun activities as he previously did.  I suspect his chronic medical conditions and physical deconditioning have played a role, however, I am concerned his weight loss may be in part to underlying depression.    - Dietitian consulted - Outpatient follow-up with PCP; patient may benefit from a low-dose SSRI - Vitamin B12 level pending given previous severe deficiency  Hypothyroidism - Continue home Synthroid  Advance Care Planning:   Code Status: DNR/DNI.  Patient states he has not wanted any cardiac or pulmonary resuscitation for at least 10 years and has had a signed DNR form in his home.  Consults: None  Family Communication: Patient's daughters and sons updated at bedside  Severity of Illness: The appropriate patient status for this patient is OBSERVATION. Observation status is judged to be reasonable and necessary in order to provide the required intensity of  service to ensure the patient's safety. The patient's presenting symptoms, physical exam findings, and initial radiographic and laboratory data in the context of their medical condition is felt to place them at decreased risk for further clinical deterioration. Furthermore, it is anticipated that the patient will be medically stable for discharge from the hospital within 2 midnights of admission.   Author: Verdene Lennert, MD 06/15/2023 7:19 PM  For on call review [email or web address].

## 2023-06-15 NOTE — Assessment & Plan Note (Addendum)
Patient reports a poor appetite for at least 1 year now, but notes he has been losing weight gradually due to this.  He notes that eating used to bring him joy but no longer does so.  He also does not enjoy fun activities as he previously did.  I suspect his chronic medical conditions and physical deconditioning have played a role, however, I am concerned his weight loss may be in part to underlying depression.    - Dietitian consulted - Outpatient follow-up with PCP; patient may benefit from a low-dose SSRI - Vitamin B12 level pending given previous severe deficiency

## 2023-06-15 NOTE — Assessment & Plan Note (Signed)
Patient has a history of CKD stage IIIb with most recent creatinine ranging between 1.8 and 2.1.  Renal function currently at baseline.  - Monitor renal function while IV diuresing - Avoid other nephrotoxic agents as able

## 2023-06-15 NOTE — ED Provider Notes (Signed)
[facility] Provider Note    Event Date/Time   First MD Initiated Contact with Patient 06/15/23 1343     (approximate)   History   Chest Pain   HPI  Jacob Glenn is a 89 y.o. male with history of sinus bradycardia s/p pacemaker, CHF, A-fib on Eliquis presenting to the emergency department for evaluation of chest pain.  Patient reports that for the past several weeks he has had right-sided chest pain with associated shortness of breath.  He had a follow-up appointment with his primary care doctor for further evaluation today, but was noted to have respiratory distress on presentation and was directed to the ER.    Physical Exam   Triage Vital Signs: ED Triage Vitals [06/15/23 1226]  Encounter Vitals Group     BP 111/60     Systolic BP Percentile      Diastolic BP Percentile      Pulse Rate 64     Resp (!) 24     Temp 97.9 F (36.6 C)     Temp Source Oral     SpO2 93 %     Weight      Height      Head Circumference      Peak Flow      Pain Score      Pain Loc      Pain Education      Exclude from Growth Chart     Most recent vital signs: Vitals:   06/15/23 1226 06/15/23 1547  BP: 111/60 128/73  Pulse: 64   Resp: (!) 24 19  Temp: 97.9 F (36.6 C)   SpO2: 93%      General: Awake, interactive  CV:  Regular rate, good peripheral perfusion.  Resp:  Lungs clear, tachypneic with somewhat labored respirations Abd:  Soft, nondistended.  Neuro:  Symmetric facial movement, fluid speech   ED Results / Procedures / Treatments   Labs (all labs ordered are listed, but only abnormal results are displayed) Labs Reviewed  BASIC METABOLIC PANEL - Abnormal; Notable for the following components:      Result Value   Glucose, Bld 118 (*)    BUN 25 (*)    Creatinine, Ser 1.71 (*)    Calcium 8.6 (*)    GFR, Estimated 38 (*)    All other components within normal limits  BRAIN NATRIURETIC PEPTIDE - Abnormal; Notable for the following components:    B Natriuretic Peptide 641.3 (*)    All other components within normal limits  HEPATIC FUNCTION PANEL - Abnormal; Notable for the following components:   Albumin 3.2 (*)    Bilirubin, Direct 0.3 (*)    All other components within normal limits  TROPONIN I (HIGH SENSITIVITY) - Abnormal; Notable for the following components:   Troponin I (High Sensitivity) 155 (*)    All other components within normal limits  CBC  TROPONIN I (HIGH SENSITIVITY)     EKG EKG independently reviewed interpreted by myself (ER attending) demonstrates:  EKG demonstrates ventricularly paced rhythm at a rate of 62, QRS 148, QTc 475, no noted superimposed ischemic changes  RADIOLOGY Imaging independently reviewed and interpreted by myself demonstrates:  CXR without focal consolidation CT of the chest pending  PROCEDURES:  Critical Care performed: No  Procedures   MEDICATIONS ORDERED IN ED: Medications  iohexol (OMNIPAQUE) 350 MG/ML injection 60 mL (60 mLs Intravenous Contrast Given 06/15/23 1445)  aspirin tablet 325 mg (325 mg Oral Given 06/15/23  1544)     IMPRESSION / MDM / ASSESSMENT AND PLAN / ED COURSE  I reviewed the triage vital signs and the nursing notes.  Differential diagnosis includes, but is not limited to, pulmonary embolism, pneumonia, pneumothorax, ACS, arrhythmia, CHF exacerbation  Patient's presentation is most consistent with acute presentation with potential threat to life or bodily function.  89 year old male presenting to the emergency department for evaluation of shortness of breath. Labs from triage notable for elevated troponin at 155, no recent prior in our system in comparison.  Stable renal impairment.  CBC without severe derangement.  BNP elevated at 641.  Reportedly hypoxic in the office, 93% on room air here, but patient does feel subjective improvement in his work of breathing on supplemental oxygen.  Will obtain CT of the chest to further evaluate.  Signed out to  oncoming provider pending CTA, reevaluation.  Ordered for aspirin with elevated troponin.  Ultimately anticipate admission given patient's elevated troponin in the setting of chest pain.      FINAL CLINICAL IMPRESSION(S) / ED DIAGNOSES   Final diagnoses:  Shortness of breath     Rx / DC Orders   ED Discharge Orders     None        Note:  This document was prepared using Dragon voice recognition software and may include unintentional dictation errors.   Trinna Post, MD 06/15/23 [phone]

## 2023-06-15 NOTE — Assessment & Plan Note (Signed)
Per chart review, patient has a history of congestive heart failure, initially due to RV pacing induced cardiomyopathy s/p CRT-D placement.  Most recent echocardiogram in January 2023 demonstrated LV EF of 45% with moderate LVH and diffusely hypocontractile.  He seems to have had multiple increases in the reductions in Lasix, most recently in April when his Lasix was reduced to once daily.  Given prolonged onset of 3 weeks, pulmonary edema seen on CT and elevated BNP, current presentation is most likely acute exacerbation.  Low suspicion for active infection.  - Telemetry monitoring - Echocardiogram ordered - Lasix 40 mg IV daily - Strict in and out - Daily weights - Monitor potassium and magnesium

## 2023-06-15 NOTE — Assessment & Plan Note (Signed)
-   Continue home carvedilol and apixaban

## 2023-06-15 NOTE — Assessment & Plan Note (Signed)
Patient's blood pressure is on the lower end of normal given diuresis.  Due to this, will hold losartan but continue carvedilol.  - Continue home carvedilol

## 2023-06-15 NOTE — ED Notes (Signed)
Light Blue, Lavender & Light Green Blood Tubes sent to Lab

## 2023-06-15 NOTE — Assessment & Plan Note (Signed)
-   Continue home Synthroid °

## 2023-06-15 NOTE — Assessment & Plan Note (Signed)
Patient states he has become gradually less functional, stating that he gets very winded and fatigued with any exertion.  This has led him to become mostly sedentary.  I suspect that in addition to CHF, physical deconditioning is playing a large role.  - PT/OT

## 2023-06-16 ENCOUNTER — Observation Stay (HOSPITAL_BASED_OUTPATIENT_CLINIC_OR_DEPARTMENT_OTHER)
Admit: 2023-06-16 | Discharge: 2023-06-16 | Disposition: A | Discharge status: Home / Self Care | Payer: Medicare Other | Attending: Internal Medicine | Admitting: Internal Medicine

## 2023-06-16 ENCOUNTER — Encounter: Payer: Self-pay | Admitting: Internal Medicine

## 2023-06-16 DIAGNOSIS — I5023 Acute on chronic systolic (congestive) heart failure: Secondary | ICD-10-CM | POA: Diagnosis not present

## 2023-06-16 LAB — BASIC METABOLIC PANEL
Anion gap: 9 (ref 5–15)
BUN: 26 mg/dL — ABNORMAL HIGH (ref 8–23)
CO2: 34 mmol/L — ABNORMAL HIGH (ref 22–32)
Calcium: 8.5 mg/dL — ABNORMAL LOW (ref 8.9–10.3)
Chloride: 96 mmol/L — ABNORMAL LOW (ref 98–111)
Creatinine, Ser: 1.8 mg/dL — ABNORMAL HIGH (ref 0.61–1.24)
GFR, Estimated: 36 mL/min — ABNORMAL LOW (ref >60)
Glucose, Bld: 118 mg/dL — ABNORMAL HIGH (ref 70–99)
Potassium: 3.3 mmol/L — ABNORMAL LOW (ref 3.5–5.1)
Sodium: 139 mmol/L (ref 135–145)

## 2023-06-16 LAB — MAGNESIUM: Magnesium: 2.3 mg/dL (ref 1.7–2.4)

## 2023-06-16 LAB — VITAMIN B12: Vitamin B-12: 335 pg/mL (ref 180–914)

## 2023-06-16 MED ORDER — ENSURE ENLIVE PO LIQD
237.0000 mL | Freq: Three times a day (TID) | ORAL | Status: DC
  Administered 2023-06-16 – 2023-06-19 (×7): 237 mL via ORAL

## 2023-06-16 MED ORDER — POTASSIUM CHLORIDE 20 MEQ PO PACK
40.0000 meq | PACK | Freq: Two times a day (BID) | ORAL | Status: AC
  Administered 2023-06-16 – 2023-06-17 (×4): 40 meq via ORAL
  Filled 2023-06-16 (×4): qty 2

## 2023-06-16 MED ORDER — ADULT MULTIVITAMIN W/MINERALS CH
1.0000 | ORAL_TABLET | Freq: Every day | ORAL | Status: DC
  Administered 2023-06-16 – 2023-06-19 (×4): 1 via ORAL
  Filled 2023-06-16 (×4): qty 1

## 2023-06-16 NOTE — Progress Notes (Signed)
Progress Note   Patient: Jacob Glenn VOZ:[id number] DOB: April 12, 1932 DOA: 06/15/2023     0 DOS: the patient was seen and examined on 06/16/2023   Brief hospital course:  89 y.o. 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   Mr. Biehl states that for the last 3 weeks, he has been experiencing significant fatigue, especially with exertion in addition to right-sided chest pain and shortness of breath.  He notes that chest pain is associated with his difficulty breathing.  He denies any substernal or left-sided chest pain.  He denies any palpitations or lower extremity swelling.  He endorses orthopnea but states it is alleviated by laying on his left side.  He has been taking Lasix daily without any missed doses recently, however states that his dose is frequently adjusted based off his kidney function.  He denies any fever, chills, nausea, vomiting.  He has had a very poor appetite essentially over the last year but it has gotten worse over the last several months and this has led to some weight loss.  He denies any abdominal distention.   ED Course:  On arrival to the ED, patient was normotensive at 111/60 with heart rate of 64.  He was saturating at 93% on room air.  He was tachypneic at 24/minute.  He was afebrile at 97.9. Initial workup notable for normal CBC, and CMP with glucose of 118, BUN 25, creatinine 1.71 with GFR of 38.  BNP elevated 641.  Troponin elevated at 155 with flat trend of 149.  CT a was obtained, that was negative for PE, however evidence of bilateral pleural effusions and edema.  Patient started on IV Lasix and aspirin.  TRH contacted for admission.   8/16 : Patient reports mild improvement in symptoms however not at his baseline.  Has bilateral basal crackles.  No  complaint of substernal chest pain or palpitations.  Continues to stay on supplemental oxygen at 3 L.  Assessment and Plan:  * Acute on chronic systolic CHF (congestive heart failure) (HCC) Per chart review, patient has a history of congestive heart failure, initially due to RV pacing induced cardiomyopathy s/p CRT-D placement.  Most recent echocardiogram in January 2023 demonstrated LV EF of 45% with moderate LVH and diffusely hypocontractile.  He seems to have had multiple increases in the reductions in Lasix, most recently in April when his Lasix was reduced to once daily.  Given prolonged onset of 3 weeks, pulmonary edema seen on CT and elevated BNP, current presentation is most likely acute exacerbation.  Low suspicion for active infection.  - Telemetry monitoring - Echocardiogram -pending - Lasix 40 mg IV daily - Strict in and out net 1 L negative today - Daily weights - Monitor potassium and magnesium  Physical deconditioning Patient states he has become gradually less functional, stating that he gets very winded and fatigued with any exertion.  This has led him to become mostly sedentary.  I suspect that in addition to CHF, physical deconditioning is playing a large role.  - PT/OT  Chronic kidney disease, stage 3b (HCC) Patient has a history of CKD stage IIIb with most recent creatinine ranging between 1.8 and 2.1.  Renal function currently at baseline.  - Monitor renal function while IV diuresing - Avoid other nephrotoxic agents as able  Essential hypertension Patient's  blood pressure is on the lower end of normal given diuresis.  Due to this, will hold losartan but continue carvedilol.  - Continue home carvedilol  Atrial fibrillation (HCC) - Continue home carvedilol and apixaban  Weight loss Patient reports a poor appetite for at least 1 year now, but notes he has been losing weight gradually due to this.  He notes that eating used to bring him joy but no longer does so.   He also does not enjoy fun activities as he previously did.  I suspect his chronic medical conditions and physical deconditioning have played a role, however, I am concerned his weight loss may be in part to underlying depression.    - Dietitian consulted - Outpatient follow-up with PCP; patient may benefit from a low-dose SSRI - Vitamin B12 level pending given previous severe deficiency  Hypothyroidism - Continue home Synthroid        Subjective: No improvement in symptoms.  Continues to feel the same as he has felt in 1 week.  However no worsening either.  Physical Exam: Vitals:   06/16/23 0346 06/16/23 0816 06/16/23 1000 06/16/23 1245  BP: (!) 97/54 (!) 113/56  (!) 102/44  Pulse: 62 63  61  Resp: 17 20  20   Temp: 97.6 F (36.4 C) 98 F (36.7 C)  97.9 F (36.6 C)  TempSrc:  Oral  Oral  SpO2: 98% 98% 95% 95%  Weight:  69.8 kg    Height:       S1-S2 regular with no grade 3 murmur, normal appearance, atraumatic normal cephalic head, no pitting edema, diffuse Rales with decreased breath sounds more pronounced on the lower right lung base.  Abdomen nontender nondistended, no neurological deficit facial symmetry preserved.    Data Reviewed:  There are no new results to review at this time.  Family Communication: Son by bedside updated about the status  Disposition: Status is: Observation The patient will require care spanning > 2 midnights and should be moved to inpatient because: Acute CHF   Planned Discharge Destination: Home    Time spent: 32 minutes  Author: Kirstie Peri, MD 06/16/2023 2:32 PM  For on call review [email or web address].

## 2023-06-16 NOTE — Evaluation (Signed)
Physical Therapy Evaluation Patient Details Name: Jacob Glenn MRN: [id number] DOB: [birth year] Today's Date: 06/16/2023  History of Present Illness  89 y/o male presented to ED on 06/15/23 for chest pain. Admitted for CHF exacerbation. 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 PTA, patient lives alone and independent with furniture walking. Son present during session and states patient will be discharging to patient's daughter's home, however will be alone during the day as daughter works. Patient presents with weakness, impaired balance, and decreased activity tolerance. On 3L O2 during session, spO2 >92% throughout. Overall, patient is at min guard level with RW to ambulate within room. Patient will benefit from skilled PT services during acute stay to address listed deficits. Patient will benefit from ongoing therapy at discharge to maximize functional independence and safety.       If plan is discharge home, recommend the following: A little help with bathing/dressing/bathroom;Assistance with cooking/housework;Help with stairs or ramp for entrance;Assist for transportation   Can travel by private vehicle        Equipment Recommendations Rolling Humzah Harty (2 wheels)  Recommendations for Other Services       Functional Status Assessment Patient has had a recent decline in their functional status and demonstrates the ability to make significant improvements in function in a reasonable and predictable amount of time.     Precautions / Restrictions Precautions Precautions: Fall Precaution Comments: watch O2 Restrictions Weight Bearing Restrictions: No      Mobility  Bed Mobility Overal bed mobility: Needs Assistance Bed Mobility: Supine to Sit, Sit to Supine     Supine to sit: Contact  guard Sit to supine: Contact guard assist        Transfers Overall transfer level: Needs assistance Equipment used: Rolling Tajah Schreiner (2 wheels) Transfers: Sit to/from Stand Sit to Stand: Contact guard assist                Ambulation/Gait Ambulation/Gait assistance: Contact guard assist Gait Distance (Feet): 10 Feet (x 2) Assistive device: Rolling Lashaunda Schild (2 wheels) Gait Pattern/deviations: Step-through pattern, Decreased stride length Gait velocity: decreased     General Gait Details: min guard for safety  Stairs            Wheelchair Mobility     Tilt Bed    Modified Rankin (Stroke Patients Only)       Balance Overall balance assessment: Mild deficits observed, not formally tested                                           Pertinent Vitals/Pain Pain Assessment Pain Assessment: No/denies pain    Home Living Family/patient expects to be discharged to:: Private residence Living Arrangements: Children (PTA, patient lives alone but planning to d/c to daughter's home) Available Help at Discharge: Family Type of Home: House Home Access: Stairs to enter Entrance Stairs-Rails: Right Entrance Stairs-Number of Steps: 3   Home Layout: One level Home Equipment: Agricultural consultant (2 wheels)      Prior Function Prior Level of Function : Independent/Modified Independent;Driving             Mobility Comments: furniture walks in the home       Extremity/Trunk Assessment   Upper Extremity Assessment Upper Extremity Assessment: Defer  to OT evaluation    Lower Extremity Assessment Lower Extremity Assessment: Generalized weakness    Cervical / Trunk Assessment Cervical / Trunk Assessment: Kyphotic  Communication   Communication Communication: No apparent difficulties  Cognition Arousal: Alert Behavior During Therapy: WFL for tasks assessed/performed Overall Cognitive Status: Within Functional Limits for tasks assessed                                           General Comments General comments (skin integrity, edema, etc.): on 3L during mobility, spO2> 92%    Exercises     Assessment/Plan    PT Assessment Patient needs continued PT services  PT Problem List Decreased strength;Decreased activity tolerance;Decreased balance;Decreased mobility;Decreased knowledge of precautions;Cardiopulmonary status limiting activity       PT Treatment Interventions Gait training;DME instruction;Stair training;Functional mobility training;Therapeutic activities;Therapeutic exercise;Balance training;Patient/family education    PT Goals (Current goals can be found in the Care Plan section)  Acute Rehab PT Goals Patient Stated Goal: to go home PT Goal Formulation: With patient Time For Goal Achievement: 06/30/23 Potential to Achieve Goals: Good    Frequency Min 1X/week     Co-evaluation               AM-PAC PT "6 Clicks" Mobility  Outcome Measure Help needed turning from your back to your side while in a flat bed without using bedrails?: A Little Help needed moving from lying on your back to sitting on the side of a flat bed without using bedrails?: A Little Help needed moving to and from a bed to a chair (including a wheelchair)?: A Little Help needed standing up from a chair using your arms (e.g., wheelchair or bedside chair)?: A Little Help needed to walk in hospital room?: A Little Help needed climbing 3-5 steps with a railing? : A Little 6 Click Score: 18    End of Session Equipment Utilized During Treatment: Oxygen Activity Tolerance: Patient tolerated treatment well Patient left: in bed;with call bell/phone within reach;with bed alarm set;with family/visitor present Nurse Communication: Mobility status PT Visit Diagnosis: Unsteadiness on feet (R26.81);Muscle weakness (generalized) (M62.81)    Time: 2440-1027 PT Time Calculation (min) (ACUTE ONLY): 18 min   Charges:   PT  Evaluation $PT Eval Low Complexity: 1 Low   PT General Charges $$ ACUTE PT VISIT: 1 Visit         Maylon Peppers, PT, DPT Physical Therapist - [facility]  [facility]   Keelin Sheridan A Duvan Mousel 06/16/2023, 12:03 PM

## 2023-06-16 NOTE — Progress Notes (Signed)
Initial Nutrition Assessment  DOCUMENTATION CODES:   Severe malnutrition in context of chronic illness  INTERVENTION:   -MVI with minerals daily -Ensure Enlive po TID, each supplement provides 350 kcal and 20 grams of protein.  -Liberalize diet to 2 gram sodium for wider variety of meal selections  NUTRITION DIAGNOSIS:   Severe Malnutrition related to chronic illness (CHF) as evidenced by moderate fat depletion, severe fat depletion, moderate muscle depletion, severe muscle depletion.  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  Pt admitted with chest pain and CHF.   Reviewed I/O's: -1.1 L x 24 hours   UOP: 1.3 L x 24 hours   Spoke with pt and son at bedside. Pt reports he lives alone, but has a lot of family support. Son shares that pt has experienced a general decline in health over the past 3-4 month, but has worsened in the past 2-4 weeks. Pt shares that he feels more weak, but does has not fallen because he is "really careful".   Pt shares that he has limited intake due to poor appetite. Pt consumes 2 meals per day (Breakfast: eggs and sausage and Dinner: sandwich). He drinks some water and tea. Pt estimates he consumed about 25% of his breakfast this morning. He denies any chewing or swallowing difficulty. Noted meal completions 100%. Pt admits that he consumed part of an [employer] sandwich brought in by family last night.   Pt reports his UBW is around 190#. He estimates he has lot about 10# in the past 2 weeks. Noted distant history of weight loss.   Discussed  importance of good meal and supplement intake to promote healing. Pt amenable to supplements- drank an Ensure mixed with some milk to "make it less thick". Per son, pt will likely stay with daughter after discharge.   Medications reviewed and include lasix and potassium chloride.   No results found for: "HGBA1C" PTA DM medications are none.   Labs reviewed: K: 3.3 (on supplementation), CBGS: 115 (inpatient orders for glycemic control are none).    NUTRITION - FOCUSED PHYSICAL EXAM:  Flowsheet Row Most Recent Value  Orbital Region Moderate depletion  Upper Arm Region Severe depletion  Thoracic and Lumbar Region Severe depletion  Buccal Region Moderate depletion  Temple Region Moderate depletion  Clavicle Bone Region Severe depletion  Clavicle and Acromion Bone Region Severe depletion  Scapular Bone Region Severe depletion  Dorsal Hand Severe depletion  Patellar Region Moderate depletion  Anterior Thigh Region Moderate depletion  Posterior Calf Region Moderate depletion  Edema (RD Assessment) None  Hair Reviewed  Eyes Reviewed  Mouth Reviewed  Skin Reviewed  Nails Reviewed      Diet Order:   Diet Order             Diet 2 gram sodium Fluid consistency: Thin  Diet effective now                   EDUCATION NEEDS:   Education needs have been addressed  Skin:  Skin Assessment: Reviewed RN Assessment  Last BM:  06/15/23  Height:   Ht Readings from Last  1 Encounters:  06/15/23 6\' 1"  (1.854 m)    Weight:   Wt Readings from Last 1 Encounters:  06/16/23 69.8 kg    Ideal Body Weight:  83.6 kg  BMI:  Body mass index is 20.3 kg/m.  Estimated Nutritional Needs:   Kcal:  2000-2200  Protein:  105-120 grams  Fluid:  > 2 L    Levada Schilling, RD, LDN, CDCES Registered Dietitian II Certified Diabetes Care and Education Specialist Please refer to [facility] for RD and/or RD on-call/weekend/after hours pager

## 2023-06-16 NOTE — Evaluation (Signed)
Occupational Therapy Evaluation Patient Details Name: Jacob Glenn MRN: [id number] DOB: [birth year] Today's Date: 06/16/2023   History of Present Illness 89 y/o male presented to ED on 06/15/23 for chest pain. Admitted for CHF exacerbation. 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 Patient reports being Ind at baseline without use of AD and living at home alone. Pt on 3Ls O2 via [place] during session secondary to being SOB with O2 saturation in the 90's throughout. Pt needing min guard - min A with RW for functional mobility and fatigues quickly. Patient will benefit from acute OT to increase overall independence in the areas of ADLs, functional mobility, and safety awareness in order to safely discharge.      If plan is discharge home, recommend the following: A little help with walking and/or transfers;A little help with bathing/dressing/bathroom;Assistance with cooking/housework;Assist for transportation;Help with stairs or ramp for entrance    Functional Status Assessment  Patient has had a recent decline in their functional status and demonstrates the ability to make significant improvements in function in a reasonable and predictable amount of time.  Equipment Recommendations  None recommended by OT       Precautions / Restrictions Precautions Precautions: Fall Precaution Comments: watch O2 Restrictions Weight Bearing Restrictions: No      Mobility Bed Mobility Overal bed mobility: Needs Assistance Bed Mobility: Supine to Sit, Sit to Supine     Supine to sit: Contact guard Sit to supine: Contact guard assist        Transfers Overall transfer level: Needs assistance Equipment used: Rolling  walker (2 wheels) Transfers: Sit to/from Stand Sit to Stand: Contact guard assist                  Balance Overall balance assessment: Mild deficits observed, not formally tested                                         ADL either performed or assessed with clinical judgement   ADL Overall ADL's : Needs assistance/impaired     Grooming: Wash/dry hands;Wash/dry face;Standing;Contact guard assist                                 General ADL Comments: Pt stands to use urinal with min guard- min A for standing balance     Vision Patient Visual Report: No change from baseline              Pertinent Vitals/Pain Pain Assessment Pain Assessment: No/denies pain     Extremity/Trunk Assessment Upper Extremity Assessment Upper Extremity Assessment: Defer to OT evaluation   Lower Extremity Assessment Lower Extremity Assessment: Generalized weakness   Cervical / Trunk Assessment Cervical / Trunk Assessment: Kyphotic   Communication Communication Communication: No apparent difficulties   Cognition Arousal: Alert Behavior During Therapy: WFL for tasks assessed/performed Overall Cognitive Status: Within Functional Limits for tasks assessed                                       General Comments  on 3L during mobility, spO2> 92%            Home Living Family/patient expects to be discharged to:: Private residence Living Arrangements: Children (Pt lives alone but plans to d/c to daughters home) Available Help at Discharge: Family Type of Home: House Home Access: Stairs to enter Secretary/administrator of Steps: 3 Entrance Stairs-Rails: Right Home Layout: One level     Bathroom Shower/Tub: Producer, television/film/video: Handicapped height     Home Equipment: Agricultural consultant (2 wheels)          Prior Functioning/Environment Prior Level of Function : Independent/Modified Independent;Driving              Mobility Comments: furniture walks in the home          OT Problem List: Decreased strength;Decreased safety awareness;Decreased activity tolerance;Impaired balance (sitting and/or standing);Decreased knowledge of use of DME or AE;Cardiopulmonary status limiting activity      OT Treatment/Interventions: Self-care/ADL training;Therapeutic exercise;Therapeutic activities;Energy conservation;DME and/or AE instruction;Patient/family education;Balance training    OT Goals(Current goals can be found in the care plan section) Acute Rehab OT Goals Patient Stated Goal: to go home OT Goal Formulation: With patient/family Time For Goal Achievement: 06/30/23 Potential to Achieve Goals: Fair ADL Goals Pt Will Perform Grooming: with modified independence;standing Pt Will Perform Lower Body Dressing: with modified independence;sit to/from stand Pt Will Transfer to Toilet: with modified independence;ambulating Pt Will Perform Toileting - Clothing Manipulation and hygiene: with modified independence;sit to/from stand  OT Frequency: Min 1X/week       AM-PAC OT "6 Clicks" Daily Activity     Outcome Measure Help from another person eating meals?: None Help from another person taking care of personal grooming?: A Little Help from another person toileting, which includes using toliet, bedpan, or urinal?: A Little Help from another person bathing (including washing, rinsing, drying)?: A Little Help from another person to put on and taking off regular upper body clothing?: None Help from another person to put on and taking off regular lower body clothing?: A Little 6 Click Score: 20   End of Session Equipment Utilized During Treatment: Oxygen Nurse Communication: Mobility status  Activity Tolerance: Patient tolerated treatment well Patient left: in bed;with call bell/phone within reach;with bed alarm set  OT Visit Diagnosis: Unsteadiness on feet (R26.81);Muscle weakness (generalized)  (M62.81);Repeated falls (R29.6)                Time: 2440-1027 OT Time Calculation (min): 21 min Charges:  OT General Charges $OT Visit: 1 Visit OT Evaluation $OT Eval Low Complexity: 1 Low  Jackquline Denmark, MS, OTR/L , CBIS ascom [phone]  06/16/23, 12:59 PM

## 2023-06-16 NOTE — Care Management Obs Status (Signed)
MEDICARE OBSERVATION STATUS NOTIFICATION   Patient Details  Name: Jacob Glenn MRN: [id number] Date of Birth: 1932-05-01   Medicare Observation Status Notification Given:  Yes    Margarito Liner, LCSW 06/16/2023, 4:13 PM

## 2023-06-16 NOTE — TOC Initial Note (Signed)
Transition of Care [facility]) - Initial/Assessment Note    Patient Details  Name: Jacob Glenn MRN: [id number] Date of Birth: 11-09-1931  Transition of Care [facility] CM/SW Contact:    Margarito Liner, LCSW Phone Number: 06/16/2023, 4:25 PM  Clinical Narrative:  CSW met with patient. Son at bedside. CSW introduced role and explained that therapy recommendations would be discussed. Patient and son are agreeable to home health. First preference is [place]. They can accept but said it will be Tuesday or Wednesday before they can start services. Son is aware and agreeable. Patient stated he already has a RW at home. No further concerns. CSW encouraged patient and his son to contact CSW as needed. CSW will continue to follow patient and his son for support and facilitate return home once stable.                Expected Discharge Plan: Home w Home Health Services Barriers to Discharge: Continued Medical Work up   Patient Goals and CMS Choice   CMS Medicare.gov Compare Post Acute Care list provided to:: Patient Represenative (must comment) (Son) Choice offered to / list presented to : Patient, Adult Children      Expected Discharge Plan and Services     Post Acute Care Choice: Home Health Living arrangements for the past 2 months: Single Family Home                           HH Arranged: PT, OT HH Agency: [facility] Date [facility] Agency Contacted: 06/16/23   Representative spoke with at [facility] Agency: Lorenza Chick  Prior Living Arrangements/Services Living arrangements for the past 2 months: Single Family Home Lives with:: Self Patient language and need for interpreter reviewed:: Yes Do you feel safe going back to the place where you live?: Yes      Need for Family Participation in Patient Care: Yes (Comment) Care giver support system in place?: Yes (comment)   Criminal Activity/Legal Involvement Pertinent to Current Situation/Hospitalization: No - Comment as needed  Activities of  Daily Living Home Assistive Devices/Equipment: Eyeglasses, Grab bars around toilet ADL Screening (condition at time of admission) Patient's cognitive ability adequate to safely complete daily activities?: Yes Is the patient deaf or have difficulty hearing?: No Does the patient have difficulty seeing, even when wearing glasses/contacts?: No Does the patient have difficulty concentrating, remembering, or making decisions?: No Patient able to express need for assistance with ADLs?: Yes Does the patient have difficulty dressing or bathing?: No Independently performs ADLs?: Yes (appropriate for developmental age) Does the patient have difficulty walking or climbing stairs?: Yes Weakness of Legs: Both Weakness of Arms/Hands: Both  Permission Sought/Granted Permission sought to share information with : Facility Industrial/product designer granted to share information with : Yes, Verbal Permission Granted  Share Information with NAME: Denym Middaugh  Permission granted to share info w AGENCY: Home health agencies  Permission granted to share info w Relationship: Son  Permission granted to share info w Contact Information: [phone]  Emotional Assessment Appearance:: Appears stated age Attitude/Demeanor/Rapport: Engaged, Gracious Affect (typically observed): Accepting, Appropriate, Calm, Pleasant Orientation: : Oriented to Self, Oriented to Place, Oriented to  Time, Oriented to Situation Alcohol / Substance Use: Not Applicable Psych Involvement: No (comment)  Admission diagnosis:  Shortness of breath [R06.02] Acute on chronic HFrEF (heart failure with reduced ejection fraction) (HCC) [I50.23] Patient Active Problem List   Diagnosis Date Noted   Acute on chronic  systolic CHF (congestive heart failure) (HCC) 06/15/2023   Essential hypertension 06/15/2023   Spinal stenosis 06/15/2023   Atrial fibrillation (HCC) 06/15/2023   Physical deconditioning 06/15/2023   Weight loss 06/15/2023    CHB (complete heart block) (HCC) 12/24/2021   Secondary hyperparathyroidism of renal origin (HCC) 09/14/2020   Anemia in chronic kidney disease 08/03/2020   Benign hypertensive kidney disease with chronic kidney disease 08/03/2020   Congestive heart failure (HCC) 08/03/2020   Chronic kidney disease, stage 3b (HCC) 08/03/2020   Chronic anticoagulation 03/31/2020   B12 deficiency anemia 09/23/2019   History of prostate cancer 09/23/2019   Basal cell carcinoma of nose 08/12/2019   Sepsis (HCC) 07/29/2019   Acute lower UTI 07/29/2019   DNR (do not resuscitate) 03/29/2019   Biventricular cardiac pacemaker in situ 03/23/2016   Hypothyroidism 02/11/2016   PCP:  Marisue Ivan, MD Pharmacy:   [facility] - [place], [place] - [address] [place] [address] Phone: [phone] Fax: [phone]     Social Determinants of Health (SDOH) Social History: SDOH Screenings   Food Insecurity: No Food Insecurity (06/15/2023)  Housing: Low Risk  (06/15/2023)  Transportation Needs: No Transportation Needs (06/15/2023)  Utilities: Not At Risk (06/15/2023)  Financial Resource Strain: Low Risk  (06/15/2023)   Received from [facility]  Tobacco Use: Low Risk  (06/16/2023)  Recent Concern: Tobacco Use - Medium Risk (06/15/2023)   Received from [facility]   SDOH Interventions:     Readmission Risk Interventions     No data to display

## 2023-06-16 NOTE — Progress Notes (Signed)
*  PRELIMINARY RESULTS* Echocardiogram 2D Echocardiogram has been performed.  Jacob Glenn 06/16/2023, 11:49 AM

## 2023-06-17 DIAGNOSIS — Z79890 Hormone replacement therapy (postmenopausal): Secondary | ICD-10-CM | POA: Diagnosis not present

## 2023-06-17 DIAGNOSIS — I130 Hypertensive heart and chronic kidney disease with heart failure and stage 1 through stage 4 chronic kidney disease, or unspecified chronic kidney disease: Secondary | ICD-10-CM | POA: Diagnosis present

## 2023-06-17 DIAGNOSIS — E785 Hyperlipidemia, unspecified: Secondary | ICD-10-CM | POA: Diagnosis present

## 2023-06-17 DIAGNOSIS — Z79899 Other long term (current) drug therapy: Secondary | ICD-10-CM | POA: Diagnosis not present

## 2023-06-17 DIAGNOSIS — Z66 Do not resuscitate: Secondary | ICD-10-CM | POA: Diagnosis present

## 2023-06-17 DIAGNOSIS — I5023 Acute on chronic systolic (congestive) heart failure: Secondary | ICD-10-CM | POA: Diagnosis present

## 2023-06-17 DIAGNOSIS — Z950 Presence of cardiac pacemaker: Secondary | ICD-10-CM | POA: Diagnosis not present

## 2023-06-17 DIAGNOSIS — E039 Hypothyroidism, unspecified: Secondary | ICD-10-CM | POA: Diagnosis present

## 2023-06-17 DIAGNOSIS — Z6828 Body mass index (BMI) 28.0-28.9, adult: Secondary | ICD-10-CM | POA: Diagnosis not present

## 2023-06-17 DIAGNOSIS — I4891 Unspecified atrial fibrillation: Secondary | ICD-10-CM | POA: Diagnosis present

## 2023-06-17 DIAGNOSIS — R5381 Other malaise: Secondary | ICD-10-CM | POA: Diagnosis not present

## 2023-06-17 DIAGNOSIS — Z8249 Family history of ischemic heart disease and other diseases of the circulatory system: Secondary | ICD-10-CM | POA: Diagnosis not present

## 2023-06-17 DIAGNOSIS — I429 Cardiomyopathy, unspecified: Secondary | ICD-10-CM | POA: Diagnosis present

## 2023-06-17 DIAGNOSIS — Z7901 Long term (current) use of anticoagulants: Secondary | ICD-10-CM | POA: Diagnosis not present

## 2023-06-17 DIAGNOSIS — N1832 Chronic kidney disease, stage 3b: Secondary | ICD-10-CM | POA: Diagnosis present

## 2023-06-17 DIAGNOSIS — I2510 Atherosclerotic heart disease of native coronary artery without angina pectoris: Secondary | ICD-10-CM | POA: Diagnosis present

## 2023-06-17 DIAGNOSIS — Z1152 Encounter for screening for COVID-19: Secondary | ICD-10-CM | POA: Diagnosis not present

## 2023-06-17 DIAGNOSIS — E43 Unspecified severe protein-calorie malnutrition: Secondary | ICD-10-CM | POA: Diagnosis present

## 2023-06-17 DIAGNOSIS — R0602 Shortness of breath: Secondary | ICD-10-CM | POA: Diagnosis present

## 2023-06-17 LAB — CBC
HCT: 38.1 % — ABNORMAL LOW (ref 39.0–52.0)
Hemoglobin: 12.7 g/dL — ABNORMAL LOW (ref 13.0–17.0)
MCH: 30.6 pg (ref 26.0–34.0)
MCHC: 33.3 g/dL (ref 30.0–36.0)
MCV: 91.8 fL (ref 80.0–100.0)
Platelets: 252 10*3/uL (ref 150–400)
RBC: 4.15 MIL/uL — ABNORMAL LOW (ref 4.22–5.81)
RDW: 13.1 % (ref 11.5–15.5)
WBC: 6.5 10*3/uL (ref 4.0–10.5)
nRBC: 0 % (ref 0.0–0.2)

## 2023-06-17 LAB — COMPREHENSIVE METABOLIC PANEL
ALT: 17 U/L (ref 0–44)
AST: 26 U/L (ref 15–41)
Albumin: 2.6 g/dL — ABNORMAL LOW (ref 3.5–5.0)
Alkaline Phosphatase: 82 U/L (ref 38–126)
Anion gap: 8 (ref 5–15)
BUN: 28 mg/dL — ABNORMAL HIGH (ref 8–23)
CO2: 30 mmol/L (ref 22–32)
Calcium: 8.6 mg/dL — ABNORMAL LOW (ref 8.9–10.3)
Chloride: 99 mmol/L (ref 98–111)
Creatinine, Ser: 1.49 mg/dL — ABNORMAL HIGH (ref 0.61–1.24)
GFR, Estimated: 45 mL/min — ABNORMAL LOW (ref >60)
Glucose, Bld: 101 mg/dL — ABNORMAL HIGH (ref 70–99)
Potassium: 4.3 mmol/L (ref 3.5–5.1)
Sodium: 137 mmol/L (ref 135–145)
Total Bilirubin: 0.6 mg/dL (ref 0.3–1.2)
Total Protein: 5.7 g/dL — ABNORMAL LOW (ref 6.5–8.1)

## 2023-06-17 LAB — ECHOCARDIOGRAM COMPLETE
AR max vel: 1.69 cm2
AV Area VTI: 1.62 cm2
AV Area mean vel: 1.48 cm2
AV Mean grad: 7 mmHg
AV Peak grad: 13.2 mmHg
Ao pk vel: 1.82 m/s
Area-P 1/2: 2.92 cm2
Height: 73 in
P 1/2 time: 549 msec
S' Lateral: 3.1 cm
Weight: 2462.4 [oz_av]

## 2023-06-17 MED ORDER — FUROSEMIDE 10 MG/ML IJ SOLN
40.0000 mg | Freq: Once | INTRAMUSCULAR | Status: DC
  Filled 2023-06-17: qty 4

## 2023-06-17 NOTE — Progress Notes (Signed)
Physical Therapy Treatment Patient Details Name: Jacob Glenn MRN: [id number] DOB: Aug 05, 1932 Today's Date: 06/17/2023   History of Present Illness 89 y/o male presented to ED on 06/15/23 for chest pain. Admitted for CHF exacerbation. PMH: CHB s/p PPM complicated by RV pacing induced cardiomyopathy s/p CRT-D (2017), atrial fibrillation on Eliquis, HFrEF, hypertension, hyperlipidemia, stage IIIb CKD, hypothyroidism    PT Comments  Pt was sidelying in bed upon arrival. Supportive son at bedside. Pt agrees to PT session and remain cooperative throughout. Pt does not use AD at baseline however throughout session was encouraged to use at DC. Both pt and son state understanding. Pt was on rm air throughout. Did have one short desaturation to 86% but quickly recovers to 91% without need of O2. He demonstrated safe abilities to perform stairs with only +1 UE support. DC recs remain appropriate.    If plan is discharge home, recommend the following: A little help with bathing/dressing/bathroom;Assistance with cooking/housework;Help with stairs or ramp for entrance;Assist for transportation     Equipment Recommendations  None recommended by PT;Rolling walker (2 wheels) (pt endorsed having RW already.)       Precautions / Restrictions Precautions Precautions: Fall Precaution Comments: watch O2 Restrictions Weight Bearing Restrictions: No     Mobility  Bed Mobility Overal bed mobility: Needs Assistance Bed Mobility: Supine to Sit, Sit to Supine  Supine to sit: Supervision Sit to supine: Supervision General bed mobility comments: pt was easily able to get out/in bed. O2 90 % while seated EOB with HR in 90s and BP 114/69    Transfers Overall transfer level: Needs assistance Equipment used: None Transfers: Sit to/from Stand Sit to Stand: Contact guard assist  General transfer comment: Pt did not use AD at baseline however throughout session was advised to use at DC. both pt and son state  understanding. He has RW and SPC already.    Ambulation/Gait Ambulation/Gait assistance: Contact guard assist, Min assist Gait Distance (Feet): 200 Feet Assistive device: None, 1 person hand held assist Gait Pattern/deviations: Step-through pattern, WFL(Within Functional Limits), Narrow base of support (occasional scissoring with AD)  General Gait Details: Gait training began without AD but progressed to HHA +1 due to pt instability. several occasions of staggering but with single UE support, pt was much more steady. Easily performed stairs with single UE support   Stairs Stairs: Yes Stairs assistance: Supervision, Contact guard assist Stair Management: One rail Right, Step to pattern Number of Stairs: 4 General stair comments: no difficulty with single UE support. per pt" I have two rails at my daughters."         Cognition Arousal: Alert Behavior During Therapy: [facility] for tasks assessed/performed Overall Cognitive Status: Within Functional Limits for tasks assessed    General Comments: Pt is alert and able to follow directions consistently throughout.           General Comments General comments (skin integrity, edema, etc.): Pt was slightly SOB after stairs but overall seemed to tolerate fairly well. sao2 dropped to 87% for very short period prior to recoveryoing to 91%. pt was on rm air throughout.      Pertinent Vitals/Pain Pain Assessment Pain Assessment: No/denies pain     PT Goals (current goals can now be found in the care plan section) Acute Rehab PT Goals Patient Stated Goal: go home (will be staying with daughter) Progress towards PT goals: Progressing toward goals    Frequency    Min 1X/week  AM-PAC PT "6 Clicks" Mobility   Outcome Measure  Help needed turning from your back to your side while in a flat bed without using bedrails?: A Little Help needed moving from lying on your back to sitting on the side of a flat bed without using bedrails?:  A Little Help needed moving to and from a bed to a chair (including a wheelchair)?: A Little Help needed standing up from a chair using your arms (e.g., wheelchair or bedside chair)?: A Little Help needed to walk in hospital room?: A Little Help needed climbing 3-5 steps with a railing? : A Little 6 Click Score: 18    End of Session Equipment Utilized During Treatment:  (pt was on rm air throughout) Activity Tolerance: Patient tolerated treatment well Patient left: in bed;with call bell/phone within reach;with bed alarm set;with family/visitor present Nurse Communication: Mobility status PT Visit Diagnosis: Unsteadiness on feet (R26.81);Muscle weakness (generalized) (M62.81)     Time: 6045-4098 PT Time Calculation (min) (ACUTE ONLY): 20 min  Charges:    $Gait Training: 8-22 mins PT General Charges $$ ACUTE PT VISIT: 1 Visit                     Jetta Lout PTA 06/17/23, 5:36 PM

## 2023-06-17 NOTE — Progress Notes (Signed)
. Progress Note   Patient: Jacob Glenn WUJ:[id number] DOB: 10/19/1932 DOA: 06/15/2023     0 DOS: the patient was seen and examined on 06/17/2023   Brief hospital course:  89 y.o. 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   Jacob Glenn states that for the last 3 weeks, he has been experiencing significant fatigue, especially with exertion in addition to right-sided chest pain and shortness of breath.  He notes that chest pain is associated with his difficulty breathing.  He denies any substernal or left-sided chest pain.  He denies any palpitations or lower extremity swelling.  He endorses orthopnea but states it is alleviated by laying on his left side.  He has been taking Lasix daily without any missed doses recently, however states that his dose is frequently adjusted based off his kidney function.  He denies any fever, chills, nausea, vomiting.  He has had a very poor appetite essentially over the last year but it has gotten worse over the last several months and this has led to some weight loss.  He denies any abdominal distention.   ED Course:  On arrival to the ED, patient was normotensive at 111/60 with heart rate of 64.  He was saturating at 93% on room air.  He was tachypneic at 24/minute.  He was afebrile at 97.9. Initial workup notable for normal CBC, and CMP with glucose of 118, BUN 25, creatinine 1.71 with GFR of 38.  BNP elevated 641.  Troponin elevated at 155 with flat trend of 149.  CT a was obtained, that was negative for PE, however evidence of bilateral pleural effusions and edema.  Patient started on IV Lasix and aspirin.  TRH contacted for admission.  8/16 : Patient reports mild improvement in symptoms however not at his baseline.  Has bilateral basal crackles.  No  complaint of substernal chest pain or palpitations.  Continues to stay on supplemental oxygen at 3 L.  8/17 : Patient was on room air with mild tachypnea.  Auscultatory rails on the basal lung field.  Continues to have dyspnea on exertion.  Patient was planned to be discharged after an IV Lasix however had drop in blood pressure.  Lasix held.    Assessment and Plan:  Acute on chronic systolic CHF (congestive heart failure)   Per chart review, patient has a history of congestive heart failure, initially due to RV pacing induced cardiomyopathy s/p CRT-D placement.  Most recent echocardiogram in January 2023 demonstrated LV EF of 45% with moderate LVH and diffusely hypocontractile.  He seems to have had multiple increases in the reductions in Lasix, most recently in April when his Lasix was reduced to once daily.  Given prolonged onset of 3 weeks, pulmonary edema seen on CT and elevated BNP, current presentation is most likely acute exacerbation.  Low suspicion for active infection.  - Telemetry monitoring - Echocardiogram -showed EF of 45 to 50%. - Lasix 40 mg IV daily - Strict in and out  - Daily weights - Monitor potassium and magnesium  Physical deconditioning Patient states he has become gradually less functional, stating that he gets very winded and fatigued with any exertion.  This has led him to become mostly sedentary.  I suspect that in addition to CHF, physical deconditioning is playing a large role.  - PT/OT  Chronic kidney disease, stage 3b  Patient has a history of CKD stage IIIb with most recent creatinine ranging between 1.8 and 2.1.  Improving with creatinine 1.49 today.  - Monitor renal function while IV diuresing - Avoid other nephrotoxic agents as able  Essential hypertension Patient's blood pressure is on the lower end of normal given diuresis.  Due to this, will hold losartan but continue carvedilol.  - Continue home carvedilol  Atrial fibrillation   - Continue  home carvedilol and apixaban  Weight loss  Patient reports a poor appetite for at least 1 year now, but notes he has been losing weight gradually due to this.  He notes that eating used to bring him joy but no longer does so.  He also does not enjoy fun activities as he previously did.  I suspect his chronic medical conditions and physical deconditioning have played a role, however, I am concerned his weight loss may be in part to underlying depression.    - Dietitian consulted - Outpatient follow-up with PCP; patient may benefit from a low-dose SSRI - Vitamin B12 level pending given previous severe deficiency  Hypothyroidism - Continue home Synthroid      Subjective: No improvement in symptoms.  Continues to feel the same as he has felt in 1 week.  However no worsening either.  Physical Exam: Vitals:   06/17/23 0500 06/17/23 0845 06/17/23 1214 06/17/23 1237  BP:  122/60 (!) 74/47 (!) 90/48  Pulse:  72 61 (!) 59  Resp:  18 18   Temp:  98 F (36.7 C) 98.4 F (36.9 C)   TempSrc:  Oral Oral   SpO2:  95% 93% 90%  Weight: 74.5 kg     Height:       S1-S2 regular with no grade 3 murmur, normal appearance, atraumatic normal cephalic head, no pitting edema, diffuse Rales with decreased breath sounds more pronounced on the lower right lung base audible .  Abdomen nontender nondistended, no neurological deficit facial symmetry preserved.    Data Reviewed:  There are no new results to review at this time.  Family Communication: Son by bedside updated about the status  Disposition: Status is: Observation The patient will require care spanning > 2 midnights and should be moved to inpatient because: Acute CHF   Planned Discharge Destination: Home    Time spent: 32 minutes  Author: Kirstie Peri, MD 06/17/2023 1:28 PM  For on call review [email or web address].

## 2023-06-17 NOTE — Progress Notes (Signed)
Patients BP was 74/47, MD made aware of, IV Lasix hold, will be monitoring blood pressure, got written order in secure chat to hold evening dose Coreg.

## 2023-06-17 NOTE — Plan of Care (Signed)

## 2023-06-18 DIAGNOSIS — I5023 Acute on chronic systolic (congestive) heart failure: Secondary | ICD-10-CM | POA: Diagnosis not present

## 2023-06-18 LAB — COMPREHENSIVE METABOLIC PANEL
ALT: 19 U/L (ref 0–44)
AST: 33 U/L (ref 15–41)
Albumin: 2.9 g/dL — ABNORMAL LOW (ref 3.5–5.0)
Alkaline Phosphatase: 87 U/L (ref 38–126)
Anion gap: 7 (ref 5–15)
BUN: 30 mg/dL — ABNORMAL HIGH (ref 8–23)
CO2: 35 mmol/L — ABNORMAL HIGH (ref 22–32)
Calcium: 9.2 mg/dL (ref 8.9–10.3)
Chloride: 98 mmol/L (ref 98–111)
Creatinine, Ser: 1.76 mg/dL — ABNORMAL HIGH (ref 0.61–1.24)
GFR, Estimated: 37 mL/min — ABNORMAL LOW (ref >60)
Glucose, Bld: 99 mg/dL (ref 70–99)
Potassium: 5.6 mmol/L — ABNORMAL HIGH (ref 3.5–5.1)
Sodium: 140 mmol/L (ref 135–145)
Total Bilirubin: 0.7 mg/dL (ref 0.3–1.2)
Total Protein: 6.3 g/dL — ABNORMAL LOW (ref 6.5–8.1)

## 2023-06-18 LAB — CBC
HCT: 42.4 % (ref 39.0–52.0)
Hemoglobin: 13.9 g/dL (ref 13.0–17.0)
MCH: 29.8 pg (ref 26.0–34.0)
MCHC: 32.8 g/dL (ref 30.0–36.0)
MCV: 90.8 fL (ref 80.0–100.0)
Platelets: 277 10*3/uL (ref 150–400)
RBC: 4.67 MIL/uL (ref 4.22–5.81)
RDW: 13.2 % (ref 11.5–15.5)
WBC: 6 10*3/uL (ref 4.0–10.5)
nRBC: 0 % (ref 0.0–0.2)

## 2023-06-18 MED ORDER — ORAL CARE MOUTH RINSE: 15.0000 mL | OROMUCOSAL | Status: DC | PRN

## 2023-06-18 NOTE — Progress Notes (Signed)
Physical Therapy Treatment Patient Details Name: Jacob Glenn MRN: [id number] DOB: July 21, 1932 Today's Date: 06/18/2023   History of Present Illness 89 y/o male presented to ED on 06/15/23 for chest pain. Admitted for CHF exacerbation. PMH: CHB s/p PPM complicated by RV pacing induced cardiomyopathy s/p CRT-D (2017), atrial fibrillation on Eliquis, HFrEF, hypertension, hyperlipidemia, stage IIIb CKD, hypothyroidism    PT Comments   Pt in bed.  Stated feels the same overall.  No better, no worse.  In bed on room air.  Stated O2 is there "if I need it" but not on.  Sats 92% at rest.  Pt able to get OOB with no assist.  He is given a SPC to try but only uses it about 50% of the time during x 1 lap in hallway.  Some imbalances noted but no outside assist.  He does endorse SOB with gait which he says is his baseline but O2 sats on room air remain stable in low/mid 90's taken on R hand.  L had is colder and reads in 70's.  Education for energy conservation and AD use with pt and son.  He does have a SPC at home to use as able.     If plan is discharge home, recommend the following: A little help with bathing/dressing/bathroom;Assistance with cooking/housework;Help with stairs or ramp for entrance;Assist for transportation   Can travel by private vehicle        Equipment Recommendations       Recommendations for Other Services       Precautions / Restrictions Precautions Precautions: Fall Precaution Comments: watch O2     Mobility  Bed Mobility Overal bed mobility: Modified Independent                  Transfers Overall transfer level: Modified independent Equipment used: None Transfers: Sit to/from Stand Sit to Stand: Modified independent (Device/Increase time)                Ambulation/Gait Ambulation/Gait assistance: Contact guard assist Gait Distance (Feet): 200 Feet Assistive device: None, 1 person hand held assist Gait Pattern/deviations: Step-through  pattern Gait velocity: decreased     General Gait Details: some imbalances but no outside assist needed   Stairs             Wheelchair Mobility     Tilt Bed    Modified Rankin (Stroke Patients Only)       Balance Overall balance assessment: Mild deficits observed, not formally tested                                          Cognition Arousal: Alert Behavior During Therapy: WFL for tasks assessed/performed Overall Cognitive Status: Within Functional Limits for tasks assessed                                          Exercises      General Comments        Pertinent Vitals/Pain Pain Assessment Pain Assessment: No/denies pain    Home Living                          Prior Function            PT Goals (current goals can  now be found in the care plan section)      Frequency    Min 1X/week      PT Plan      Co-evaluation              AM-PAC PT "6 Clicks" Mobility   Outcome Measure  Help needed turning from your back to your side while in a flat bed without using bedrails?: None Help needed moving from lying on your back to sitting on the side of a flat bed without using bedrails?: None Help needed moving to and from a bed to a chair (including a wheelchair)?: None Help needed standing up from a chair using your arms (e.g., wheelchair or bedside chair)?: None Help needed to walk in hospital room?: A Little Help needed climbing 3-5 steps with a railing? : A Little 6 Click Score: 22    End of Session Equipment Utilized During Treatment: Gait belt Activity Tolerance: Patient tolerated treatment well Patient left: in bed;with call bell/phone within reach;with bed alarm set;with family/visitor present Nurse Communication: Mobility status PT Visit Diagnosis: Unsteadiness on feet (R26.81);Muscle weakness (generalized) (M62.81)     Time: 1610-9604 PT Time Calculation (min) (ACUTE ONLY): 8  min  Charges:    $Gait Training: 8-22 mins PT General Charges $$ ACUTE PT VISIT: 1 Visit                   Danielle Dess, PTA 06/18/23, 10:51 AM

## 2023-06-18 NOTE — TOC Progression Note (Addendum)
Transition of Care [facility]) - Progression Note    Patient Details  Name: Jacob Glenn MRN: [id number] Date of Birth: Sep 20, 1932  Transition of Care [facility]) CM/SW Contact  Bing Quarry, RN Phone Number: 06/18/2023, 10:31 AM  Clinical Narrative:  8/18: Remains on 2L/ this am. Set up with [facility] on 8/16 with 8/20-8/21/24 as date of start of service pending discharge. Has RW at home. Was moved to inpatient status at 1537 on 06/17/23 per provider notes/orders.  No DME oxygen orders or ambulation saturation test noted at this point.TOC will continue to follow through to disposition.  Gabriel Cirri MSN RN CM  Transitions of Care Department [facility] [phone] Weekends Only     Expected Discharge Plan: Home w Home Health Services Barriers to Discharge: Continued Medical Work up  Expected Discharge Plan and Services     Post Acute Care Choice: Home Health Living arrangements for the past 2 months: Single Family Home                           HH Arranged: PT, OT [facility] Agency: [facility] Date [facility] Agency Contacted: 06/16/23   Representative spoke with at [facility] Agency: Lorenza Chick   Social Determinants of Health (SDOH) Interventions SDOH Screenings   Food Insecurity: No Food Insecurity (06/15/2023)  Housing: Low Risk  (06/15/2023)  Transportation Needs: No Transportation Needs (06/15/2023)  Utilities: Not At Risk (06/15/2023)  Financial Resource Strain: Low Risk  (06/15/2023)   Received from [facility]  Tobacco Use: Low Risk  (06/16/2023)  Recent Concern: Tobacco Use - Medium Risk (06/15/2023)   Received [facility]    Readmission Risk Interventions     No data to display

## 2023-06-18 NOTE — Plan of Care (Signed)

## 2023-06-18 NOTE — Progress Notes (Signed)
. Progress Note   Patient: Jacob Glenn ZOX:[id number] DOB: Jan 25, 1932 DOA: 06/15/2023     1 DOS: the patient was seen and examined on 06/18/2023   Brief hospital course:  89 y.o. 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   Mr. Sanseverino states that for the last 3 weeks, he has been experiencing significant fatigue, especially with exertion in addition to right-sided chest pain and shortness of breath.  He notes that chest pain is associated with his difficulty breathing.  He denies any substernal or left-sided chest pain.  He denies any palpitations or lower extremity swelling.  He endorses orthopnea but states it is alleviated by laying on his left side.  He has been taking Lasix daily without any missed doses recently, however states that his dose is frequently adjusted based off his kidney function.  He denies any fever, chills, nausea, vomiting.  He has had a very poor appetite essentially over the last year but it has gotten worse over the last several months and this has led to some weight loss.  He denies any abdominal distention.   ED Course:  On arrival to the ED, patient was normotensive at 111/60 with heart rate of 64.  He was saturating at 93% on room air.  He was tachypneic at 24/minute.  He was afebrile at 97.9. Initial workup notable for normal CBC, and CMP with glucose of 118, BUN 25, creatinine 1.71 with GFR of 38.  BNP elevated 641.  Troponin elevated at 155 with flat trend of 149.  CT a was obtained, that was negative for PE, however evidence of bilateral pleural effusions and edema.  Patient started on IV Lasix and aspirin.  TRH contacted for admission.  8/16 : Patient reports mild improvement in symptoms however not at his baseline.  Has bilateral basal crackles.  No  complaint of substernal chest pain or palpitations.  Continues to stay on supplemental oxygen at 3 L.  8/17 : Patient was on room air with mild tachypnea.  Auscultatory rails on the basal lung field.  Continues to have dyspnea on exertion.  Patient was planned to be discharged after an IV Lasix however had drop in blood pressure.  Lasix held.    8/18 : Patient continues to have dyspnea,  stable with no worsening.  Evaluated by ICU for thoracentesis however patient on NOACs.  Will hold apixaban for possible thoracentesis tomorrow after 24 hours.  Pleural effusion could be causing his symptoms.  Assessment and Plan:  Acute on chronic systolic CHF (congestive heart failure)   Per chart review, patient has a history of congestive heart failure, initially due to RV pacing induced cardiomyopathy s/p CRT-D placement.  Most recent echocardiogram in January 2023 demonstrated LV EF of 45% with moderate LVH and diffusely hypocontractile.  He seems to have had multiple increases in the reductions in Lasix, most recently in April when his Lasix was reduced to once daily.  Given prolonged onset of 3 weeks, pulmonary edema seen on CT and elevated BNP, current presentation is most likely acute exacerbation.  Low suspicion for active infection.  - Telemetry monitoring - Echocardiogram -showed EF of 45 to 50%. - Lasix 40 mg IV daily - Strict in and out  - Daily weights - Monitor potassium and magnesium  Physical deconditioning Patient states he has  become gradually less functional, stating that he gets very winded and fatigued with any exertion.  This has led him to become mostly sedentary.  I suspect that in addition to CHF, physical deconditioning is playing a large role.  - PT/OT  Chronic kidney disease, stage 3b  Patient has a history of CKD stage IIIb with most recent creatinine ranging between 1.8 and 2.1.  Improving with creatinine 1.49 today.  - Monitor renal function while IV diuresing - Avoid  other nephrotoxic agents as able  Essential hypertension Patient's blood pressure is on the lower end of normal given diuresis.  Due to this, will hold losartan but continue carvedilol.  - Continue home carvedilol  Atrial fibrillation   - Continue home carvedilol and apixaban  Weight loss  Patient reports a poor appetite for at least 1 year now, but notes he has been losing weight gradually due to this.  He notes that eating used to bring him joy but no longer does so.  He also does not enjoy fun activities as he previously did.  I suspect his chronic medical conditions and physical deconditioning have played a role, however, I am concerned his weight loss may be in part to underlying depression.    - Dietitian consulted - Outpatient follow-up with PCP; patient may benefit from a low-dose SSRI - Vitamin B12 level pending given previous severe deficiency  Hypothyroidism - Continue home Synthroid      Subjective: Continues to feel the same better.  Wants to go home as there has not been any progress.  Off of oxygen no complaint of chest pain.  No complaint of paroxysmal nocturnal dyspnea.  Sleeping on flat bed.  Physical Exam: Vitals:   06/18/23 0355 06/18/23 0816 06/18/23 1119 06/18/23 1511  BP: 122/69 117/61 (!) 103/55 100/62  Pulse: 65 64 66 63  Resp: 20 (!) 22 (!) 24 18  Temp: 97.8 F (36.6 C) 97.7 F (36.5 C) 98.1 F (36.7 C) 98.2 F (36.8 C)  TempSrc: Oral     SpO2: 95% 100% 94% 98%  Weight:      Height:       S1-S2 regular with no grade 3 murmur, normal appearance, atraumatic normal cephalic head, no pitting edema, diffuse Rales with decreased breath sounds more pronounced on the lower right lung base audible .  Abdomen nontender nondistended, no neurological deficit facial symmetry preserved.    Data Reviewed:  There are no new results to review at this time.  Family Communication: Son by bedside updated about the status  Disposition: Status is:  Observation The patient will require care spanning > 2 midnights and should be moved to inpatient because: Acute CHF   Planned Discharge Destination: Home    Time spent: 32 minutes  Author: Kirstie Peri, MD 06/18/2023 4:16 PM  For on call review [email or web address].

## 2023-06-19 ENCOUNTER — Inpatient Hospital Stay: Payer: Medicare Other

## 2023-06-19 DIAGNOSIS — E039 Hypothyroidism, unspecified: Secondary | ICD-10-CM

## 2023-06-19 DIAGNOSIS — E43 Unspecified severe protein-calorie malnutrition: Secondary | ICD-10-CM | POA: Diagnosis not present

## 2023-06-19 DIAGNOSIS — R5381 Other malaise: Secondary | ICD-10-CM | POA: Diagnosis not present

## 2023-06-19 DIAGNOSIS — I5023 Acute on chronic systolic (congestive) heart failure: Secondary | ICD-10-CM | POA: Diagnosis not present

## 2023-06-19 DIAGNOSIS — N1832 Chronic kidney disease, stage 3b: Secondary | ICD-10-CM | POA: Diagnosis not present

## 2023-06-19 DIAGNOSIS — I480 Paroxysmal atrial fibrillation: Secondary | ICD-10-CM

## 2023-06-19 MED ORDER — FUROSEMIDE 40 MG PO TABS: 40.0000 mg | ORAL_TABLET | Freq: Every day | ORAL | 0 refills | Status: AC

## 2023-06-19 MED ORDER — SENNA 8.6 MG PO TABS: 1.0000 | ORAL_TABLET | Freq: Every day | ORAL | Status: DC

## 2023-06-19 MED ORDER — DOCUSATE SODIUM 100 MG PO CAPS: 100.0000 mg | ORAL_CAPSULE | Freq: Two times a day (BID) | ORAL | 0 refills | Status: DC

## 2023-06-19 MED ORDER — ADULT MULTIVITAMIN W/MINERALS CH: 1.0000 | ORAL_TABLET | Freq: Every day | ORAL | 3 refills | Status: DC

## 2023-06-19 MED ORDER — POLYETHYLENE GLYCOL 3350 17 G PO PACK: 17.0000 g | PACK | Freq: Every day | ORAL | 0 refills | Status: DC

## 2023-06-19 MED ORDER — POLYETHYLENE GLYCOL 3350 17 G PO PACK
17.0000 g | PACK | Freq: Every day | ORAL | Status: DC
  Administered 2023-06-19: 17 g via ORAL
  Filled 2023-06-19: qty 1

## 2023-06-19 MED ORDER — SENNA 8.6 MG PO TABS: 1.0000 | ORAL_TABLET | Freq: Every day | ORAL | 0 refills | Status: DC

## 2023-06-19 MED ORDER — DOCUSATE SODIUM 100 MG PO CAPS
100.0000 mg | ORAL_CAPSULE | Freq: Two times a day (BID) | ORAL | Status: DC
  Administered 2023-06-19: 100 mg via ORAL
  Filled 2023-06-19: qty 1

## 2023-06-19 NOTE — Plan of Care (Signed)
  Problem: Education: Goal: Knowledge of General Education information will improve Description: Including pain rating scale, medication(s)/side effects and non-pharmacologic comfort measures 06/19/2023 1633 by Lars Pinks, RN Outcome: Adequate for Discharge 06/19/2023 1458 by Lars Pinks, RN Outcome: Progressing   Problem: Health Behavior/Discharge Planning: Goal: Ability to manage health-related needs will improve 06/19/2023 1633 by Lars Pinks, RN Outcome: Adequate for Discharge 06/19/2023 1458 by Lars Pinks, RN Outcome: Progressing   Problem: Clinical Measurements: Goal: Ability to maintain clinical measurements within normal limits will improve 06/19/2023 1633 by Lars Pinks, RN Outcome: Adequate for Discharge 06/19/2023 1458 by Lars Pinks, RN Outcome: Progressing Goal: Will remain free from infection 06/19/2023 1633 by Lars Pinks, RN Outcome: Adequate for Discharge 06/19/2023 1458 by Lars Pinks, RN Outcome: Progressing Goal: Diagnostic test results will improve 06/19/2023 1633 by Lars Pinks, RN Outcome: Adequate for Discharge 06/19/2023 1458 by Lars Pinks, RN Outcome: Progressing Goal: Respiratory complications will improve 06/19/2023 1633 by Lars Pinks, RN Outcome: Adequate for Discharge 06/19/2023 1458 by Lars Pinks, RN Outcome: Progressing Goal: Cardiovascular complication will be avoided 06/19/2023 1633 by Lars Pinks, RN Outcome: Adequate for Discharge 06/19/2023 1458 by Lars Pinks, RN Outcome: Progressing   Problem: Activity: Goal: Risk for activity intolerance will decrease 06/19/2023 1633 by Lars Pinks, RN Outcome: Adequate for Discharge 06/19/2023 1458 by Lars Pinks, RN Outcome: Progressing   Problem: Nutrition: Goal: Adequate nutrition will be maintained 06/19/2023 1633 by Lars Pinks, RN Outcome: Adequate for Discharge 06/19/2023 1458 by Lars Pinks, RN Outcome:  Progressing   Problem: Coping: Goal: Level of anxiety will decrease 06/19/2023 1633 by Lars Pinks, RN Outcome: Adequate for Discharge 06/19/2023 1458 by Lars Pinks, RN Outcome: Progressing   Problem: Elimination: Goal: Will not experience complications related to bowel motility 06/19/2023 1633 by Lars Pinks, RN Outcome: Adequate for Discharge 06/19/2023 1458 by Lars Pinks, RN Outcome: Progressing Goal: Will not experience complications related to urinary retention 06/19/2023 1633 by Lars Pinks, RN Outcome: Adequate for Discharge 06/19/2023 1458 by Lars Pinks, RN Outcome: Progressing   Problem: Pain Managment: Goal: General experience of comfort will improve 06/19/2023 1633 by Lars Pinks, RN Outcome: Adequate for Discharge 06/19/2023 1458 by Lars Pinks, RN Outcome: Progressing   Problem: Safety: Goal: Ability to remain free from injury will improve 06/19/2023 1633 by Lars Pinks, RN Outcome: Adequate for Discharge 06/19/2023 1458 by Lars Pinks, RN Outcome: Progressing   Problem: Skin Integrity: Goal: Risk for impaired skin integrity will decrease 06/19/2023 1633 by Lars Pinks, RN Outcome: Adequate for Discharge 06/19/2023 1458 by Lars Pinks, RN Outcome: Progressing

## 2023-06-19 NOTE — Discharge Summary (Signed)
Physician Discharge Summary   Patient: Jacob Glenn MRN: [id number] DOB: May 24, 1932  Admit date:     06/15/2023  Discharge date: 06/19/23  Discharge Physician: Marcelino Duster   PCP: Marisue Ivan, MD   Recommendations at discharge:  {Tip this will not be part of the note when signed- Example include specific recommendations for outpatient follow-up, pending tests to follow-up on. (Optional):26781}  PCP follow up in 1 week.  Discharge Diagnoses: Principal Problem:   Acute on chronic systolic CHF (congestive heart failure) (HCC) Active Problems:   Physical deconditioning   Chronic kidney disease, stage 3b (HCC)   Essential hypertension   Atrial fibrillation (HCC)   Weight loss   Hypothyroidism   Acute on chronic HFrEF (heart failure with reduced ejection fraction) (HCC)   Protein-calorie malnutrition, severe  Resolved Problems:   * No resolved hospital problems. 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  Most recent echocardiogram in January 2023 demonstrated LV EF of 45% with moderate LVH and diffusely hypocontractile.  He seems to have had multiple increases in the reductions in Lasix, most recently in April when his Lasix was reduced to once daily.  Given prolonged onset of 3 weeks, pulmonary edema seen on CT and elevated BNP, current presentation is most likely acute exacerbation.  Low suspicion for active infection.  - Telemetry monitoring - Echocardiogram ordered - Lasix 40 mg IV daily - Strict in and out - Daily weights - Monitor potassium and magnesium  Physical deconditioning Patient states he has become gradually less functional, stating that he gets very winded and fatigued with any exertion.  This has led him to become mostly  sedentary.  I suspect that in addition to CHF, physical deconditioning is playing a large role.  - PT/OT  Chronic kidney disease, stage 3b (HCC) Patient has a history of CKD stage IIIb with most recent creatinine ranging between 1.8 and 2.1.  Renal function currently at baseline.  - Monitor renal function while IV diuresing - Avoid other nephrotoxic agents as able  Essential hypertension Patient's blood pressure is on the lower end of normal given diuresis.  Due to this, will hold losartan but continue carvedilol.  - Continue home carvedilol  Atrial fibrillation (HCC) - Continue home carvedilol and apixaban  Weight loss Patient reports a poor appetite for at least 1 year now, but notes he has been losing weight gradually due to this.  He notes that eating used to bring him joy but no longer does so.  He also does not enjoy fun activities as he previously did.  I suspect his chronic medical conditions and physical deconditioning have played a role, however, I am concerned his weight loss may be in part to underlying depression.    - Dietitian consulted - Outpatient follow-up with PCP; patient may benefit from a low-dose SSRI - Vitamin B12 level pending given previous severe deficiency  Hypothyroidism - Continue home Synthroid      {Tip this will not be part of the note when signed Body mass index is 21.67 kg/m. ,  Nutrition Documentation    Flowsheet Row ED to Hosp-Admission (Current) from 06/15/2023 in [facility]  Nutrition Problem Severe Malnutrition  Etiology chronic illness  [CHF]  Nutrition Goal Patient will meet greater than or equal to  90% of their needs  Interventions Ensure Enlive (each supplement provides 350kcal and 20 grams of protein), MVI, Liberalize Diet     ,  (Optional):26781}  {(NOTE) Pain control PDMP Statment (Optional):26782} Consultants: none Procedures performed: NONR  Disposition: Home health Diet recommendation:   Discharge Diet Orders (From admission, onward)     Start     Ordered   06/19/23 0000  Diet - low sodium heart healthy        06/19/23 1624           Cardiac diet DISCHARGE MEDICATION: Allergies as of 06/19/2023   No Known Allergies      Medication List     TAKE these medications    apixaban 2.5 MG Tabs tablet Commonly known as: ELIQUIS Take 2.5 mg by mouth 2 (two) times a day.   carvedilol 3.125 MG tablet Commonly known as: COREG Take 3.125 mg by mouth 2 (two) times daily with a meal.   docusate sodium 100 MG capsule Commonly known as: COLACE Take 1 capsule (100 mg total) by mouth 2 (two) times daily.   furosemide 40 MG tablet Commonly known as: LASIX Take 1 tablet (40 mg total) by mouth daily.   levothyroxine 100 MCG tablet Commonly known as: SYNTHROID Take 100 mcg by mouth daily.   losartan 25 MG tablet Commonly known as: COZAAR Take 25 mg by mouth daily.   multivitamin with minerals Tabs tablet Take 1 tablet by mouth daily. Start taking on: June 20, 2023   polyethylene glycol 17 g packet Commonly known as: MIRALAX / GLYCOLAX Take 17 g by mouth daily. Start taking on: June 20, 2023   senna 8.6 MG Tabs tablet Commonly known as: SENOKOT Take 1 tablet (8.6 mg total) by mouth at bedtime.   Vitamin D (Ergocalciferol) 1.25 MG (50000 UNIT) Caps capsule Commonly known as: DRISDOL Take 50,000 Units by mouth every 7 (seven) days. Sunday        Follow-up Information     [facility] Follow up.   Specialty: Home Health Services Why: They will follow up with you for your home health needs. Start of care Tuesday or Wednesday (8/20 or 8/21). Contact information: [address] [place] [address] [phone]                Discharge Exam: Filed Weights   06/15/23 2034 06/16/23 0816 06/17/23 0500  Weight: 70.6 kg 69.8 kg 74.5 kg   General - Elderly thin built Caucasian male, no apparent distress HEENT -  PERRLA, EOMI, atraumatic head, non tender sinuses. Lung - Clear, bibasal rales improved Heart - S1, S2 heard, no murmurs, rubs, trace pedal edema Neuro - Alert, awake and oriented x 3, non focal exam. Skin - Warm and dry.  Condition at discharge: stable  The results of significant diagnostics from this hospitalization (including imaging, microbiology, ancillary and laboratory) are listed below for reference.   Imaging Studies: ECHOCARDIOGRAM COMPLETE  Result Date: 06/17/2023    ECHOCARDIOGRAM REPORT   Patient Name:   Blayke D Walpole Date of Exam: 06/16/2023 Medical Rec #:  [id number]     Height:       73.0 in Accession #:    [id number]    Weight:       15 5.6 lb Date of Birth:  04-Mar-1932      BSA:          1.934 m Patient Age:    89 years      BP:  139/80 mmHg Patient Gender: M             HR:           61 bpm. Exam Location:  [facility] Procedure: 2D Echo, 3D Echo, Cardiac Doppler and Color Doppler Indications:     CHF I50.31  History:         Patient has prior history of Echocardiogram examinations. CHF,                  Arrythmias:Atrial Fibrillation and Complete heart block; Risk                  Factors:Hypertension and Non-Smoker.  Sonographer:     Dondra Prader RVT RCS Referring Phys:  [id number] Verdene Lennert Diagnosing Phys: Alwyn Pea MD IMPRESSIONS  1. Consider and infiltrative process (Amyloid , Sarcoid...).  2. Left ventricular ejection fraction, by estimation, is 45 to 50%. The left ventricle has mildly decreased function. The left ventricle has no regional wall motion abnormalities. There is moderate concentric left ventricular hypertrophy. Left ventricular diastolic parameters are consistent with Grade II diastolic dysfunction (pseudonormalization).  3. Right ventricular systolic function is normal. The right ventricular size is mildly enlarged. Mildly increased right ventricular wall thickness.  4. Left atrial size was mildly dilated.  5. Right atrial size was mildly dilated.  6. The  mitral valve is normal in structure. Trivial mitral valve regurgitation.  7. The aortic valve is grossly normal. Aortic valve regurgitation is not visualized. Comparison(s): 11/19/21 @ [facility]-EF 45%. FINDINGS  Left Ventricle: Left ventricular ejection fraction, by estimation, is 45 to 50%. The left ventricle has mildly decreased function. The left ventricle has no regional wall motion abnormalities. The left ventricular internal cavity size was normal in size. There is moderate concentric left ventricular hypertrophy. Left ventricular diastolic parameters are consistent with Grade II diastolic dysfunction (pseudonormalization). Right Ventricle: The right ventricular size is mildly enlarged. Mildly increased right ventricular wall thickness. Right ventricular systolic function is normal. Left Atrium: Left atrial size was mildly dilated. Right Atrium: Right atrial size was mildly dilated. Pericardium: There is no evidence of pericardial effusion. Mitral Valve: The mitral valve is normal in structure. Trivial mitral valve regurgitation. Tricuspid Valve: The tricuspid valve is normal in structure. Tricuspid valve regurgitation is mild. Aortic Valve: The aortic valve is grossly normal. Aortic valve regurgitation is not visualized. Aortic regurgitation PHT measures 549 msec. Aortic valve mean gradient measures 7.0 mmHg. Aortic valve peak gradient measures 13.2 mmHg. Aortic valve area, by  VTI measures 1.62 cm. Pulmonic Valve: The pulmonic valve was normal in structure. Pulmonic valve regurgitation is not visualized. Aorta: The ascending aorta was not well visualized. IAS/Shunts: No atrial level shunt detected by color flow Doppler. Additional Comments: Consider and infiltrative process (Amyloid , Sarcoid...).  LEFT VENTRICLE PLAX 2D LVIDd:         3.90 cm LVIDs:         3.10 cm LV PW:         1.50 cm LV IVS:        1.30 cm LVOT diam:     2.10 cm   3D Volume EF: LV SV:         55        3D EF:        40 % LV SV Index:   28         LV EDV:       151 ml LVOT Area:  3.46 cm  LV ESV:       91 ml                          LV SV:        60 ml RIGHT VENTRICLE          IVC RV Basal diam:  3.50 cm  IVC diam: 2.50 cm LEFT ATRIUM             Index        RIGHT ATRIUM           Index LA diam:        4.40 cm 2.27 cm/m   RA Area:     24.40 cm LA Vol (A2C):   77.6 ml 40.12 ml/m  RA Volume:   77.60 ml  40.12 ml/m LA Vol (A4C):   94.9 ml 49.07 ml/m LA Biplane Vol: 87.1 ml 45.03 ml/m  AORTIC VALVE                     PULMONIC VALVE AV Area (Vmax):    1.69 cm      PV Vmax:       0.84 m/s AV Area (Vmean):   1.48 cm      PV Peak grad:  2.8 mmHg AV Area (VTI):     1.62 cm AV Vmax:           182.00 cm/s AV Vmean:          121.333 cm/s AV VTI:            0.341 m AV Peak Grad:      13.2 mmHg AV Mean Grad:      7.0 mmHg LVOT Vmax:         88.80 cm/s LVOT Vmean:        52.000 cm/s LVOT VTI:          0.159 m LVOT/AV VTI ratio: 0.47 AI PHT:            549 msec  AORTA Ao Root diam: 3.10 cm Ao Asc diam:  3.50 cm MITRAL VALVE               TRICUSPID VALVE MV Area (PHT): 2.92 cm    TR Peak grad:   25.6 mmHg MV Decel Time: 260 msec    TR Vmax:        253.00 cm/s MV E velocity: 80.70 cm/s                            SHUNTS                            Systemic VTI:  0.16 m                            Systemic Diam: 2.10 cm Alwyn Pea MD Electronically signed by Alwyn Pea MD Signature Date/Time: 06/17/2023/11:10:05 AM    Final    CT Angio Chest PE W and/or Wo Contrast  Result Date: 06/15/2023 CLINICAL DATA:  Right-sided chest pain for 3 weeks with shortness of breath. Pulmonary embolism suspected, high probability. EXAM: CT ANGIOGRAPHY CHEST WITH CONTRAST TECHNIQUE: Multidetector CT imaging of the chest was performed using the standard protocol during bolus administration of intravenous contrast. Multiplanar CT image reconstructions and  MIPs were obtained to evaluate the vascular anatomy. RADIATION DOSE REDUCTION: This exam was performed  according to the departmental dose-optimization program which includes automated exposure control, adjustment of the mA and/or kV according to patient size and/or use of iterative reconstruction technique. CONTRAST:  60mL OMNIPAQUE IOHEXOL 350 MG/ML SOLN COMPARISON:  Chest radiographs 06/15/2023 and 07/29/2019. Noncontrast chest CT 05/13/2020. FINDINGS: Cardiovascular: The pulmonary arteries are well opacified with contrast to the level of the subsegmental branches. There is no evidence of acute pulmonary embolism. Aortic and coronary artery atherosclerosis without evidence of acute systemic arterial abnormality. Pacemaker leads are grossly unchanged. There are probable calcifications of the aortic valve. The heart size is normal. There is no pericardial effusion. Mediastinum/Nodes: There are no enlarged mediastinal, hilar or axillary lymph nodes.Chronic calcified right hilar lymph nodes. The thyroid gland, trachea and esophagus demonstrate no significant findings. Lungs/Pleura: New right-greater-than-left pleural effusions. No pneumothorax. Underlying centrilobular emphysema with increased central airway thickening and new patchy ground-glass opacities throughout the lungs, greatest in the right upper lobe. Chronic scarring at both lung apices. No confluent airspace disease or suspicious pulmonary nodule. Upper abdomen: There is reflux of contrast into the IVC and hepatic veins. The visualized upper abdomen otherwise appears unremarkable. Musculoskeletal/Chest wall: There is no chest wall mass or suspicious osseous finding. There is a paucity of subcutaneous and deep fat. Unless specific follow-up recommendations are mentioned in the findings or impression sections, no imaging follow-up of any mentioned incidental findings is recommended. Review of the MIP images confirms the above findings. IMPRESSION: 1. No evidence of acute pulmonary embolism or other acute vascular findings in the chest. 2. New  right-greater-than-left pleural effusions with new patchy ground-glass opacities throughout the lungs, greatest in the right upper lobe. Findings are suspicious for atypical infection and/or asymmetric pulmonary edema. 3. Aortic Atherosclerosis (ICD10-I70.0) and Emphysema (ICD10-J43.9). Electronically Signed   By: Carey Bullocks M.D.   On: 06/15/2023 17:01   DG Chest 2 View  Result Date: 06/15/2023 CLINICAL DATA:  Provided history: Shortness of breath.  Chest pain. EXAM: CHEST - 2 VIEW COMPARISON:  Report from chest radiographs 12/09/2021 (images unavailable). Chest CT 05/13/2020. FINDINGS: Left chest multilead implantable cardiac device. Heart size within normal limits. Aortic atherosclerosis. Upper lobe predominant pulmonary fibrosis. No appreciable airspace consolidation. No definite pleural effusion or evidence of pneumothorax. No acute osseous abnormality identified. Scoliosis. IMPRESSION: 1. No evidence of an acute cardiopulmonary abnormality. 2. Upper lobe predominant pulmonary fibrosis. 3. Aortic Atherosclerosis (ICD10-I70.0). Electronically Signed   By: Jackey Loge D.O.   On: 06/15/2023 14:52    Microbiology: Results for orders placed or performed during the hospital encounter of 06/15/23  SARS Coronavirus 2 by RT PCR (hospital order, performed in [facility] hospital lab) *cepheid single result test* Anterior Nasal Swab     Status: None   Collection Time: 06/15/23  5:33 PM   Specimen: Anterior Nasal Swab  Result Value Ref Range Status   SARS Coronavirus 2 by RT PCR NEGATIVE NEGATIVE Final    Comment: (NOTE) SARS-CoV-2 target nucleic acids are NOT DETECTED.  The SARS-CoV-2 RNA is generally detectable in upper and lower respiratory specimens during the acute phase of infection. The lowest concentration of SARS-CoV-2 viral copies this assay can detect is 250 copies / mL. A negative result does not preclude SARS-CoV-2 infection and should not be used as the sole basis for treatment or  other patient management decisions.  A negative result may occur with improper specimen collection / handling, submission of specimen  other than nasopharyngeal swab, presence of viral mutation(s) within the areas targeted by this assay, and inadequate number of viral copies (<250 copies / mL). A negative result must be combined with clinical observations, patient history, and epidemiological information.  Fact Sheet for Patients:   RoadLapTop.co.za  Fact Sheet for Healthcare Providers: http://kim-miller.com/  This test is not yet approved or  cleared by the [place] FDA and has been authorized for detection and/or diagnosis of SARS-CoV-2 by FDA under an Emergency Use Authorization (EUA).  This EUA will remain in effect (meaning this test can be used) for the duration of the COVID-19 declaration under Section 564(b)(1) of the Act, 21 U.S.C. section 360bbb-3(b)(1), unless the authorization is terminated or revoked sooner.  Performed at [facility], [address] [place], [place] [address]     Labs: CBC: Recent Labs  Lab 06/15/23 1241 06/17/23 0426 06/18/23 0348  WBC 6.4 6.5 6.0  HGB 14.2 12.7* 13.9  HCT 44.3 38.1* 42.4  MCV 93.3 91.8 90.8  PLT 299 252 277   Basic Metabolic Panel: Recent Labs  Lab 06/15/23 1241 06/16/23 0417 06/17/23 0426 06/18/23 0348  NA 136 139 137 140  K 3.6 3.3* 4.3 5.6*  CL 98 96* 99 98  CO2 32 34* 30 35*  GLUCOSE 118* 118* 101* 99  BUN 25* 26* 28* 30*  CREATININE 1.71* 1.80* 1.49* 1.76*  CALCIUM 8.6* 8.5* 8.6* 9.2  MG  --  2.3  --   --    Liver Function Tests: Recent Labs  Lab 06/15/23 1241 06/17/23 0426 06/18/23 0348  AST 30 26 33  ALT 16 17 19   ALKPHOS 101 82 87  BILITOT 1.1 0.6 0.7  PROT 7.0 5.7* 6.3*  ALBUMIN 3.2* 2.6* 2.9*   CBG: No results for input(s): "GLUCAP" in the last 168 hours.  Discharge time spent: greater than 30 minutes.  Signed: Marcelino Duster, MD [facility] 06/19/2023

## 2023-06-19 NOTE — Progress Notes (Signed)
Physical Therapy Treatment Patient Details Name: Jacob Glenn MRN: [id number] DOB: 1931/11/21 Today's Date: 06/19/2023   History of Present Illness 89 y/o male presented to ED on 06/15/23 for chest pain. Admitted for CHF exacerbation. PMH: CHB s/p PPM complicated by RV pacing induced cardiomyopathy s/p CRT-D (2017), atrial fibrillation on Eliquis, HFrEF, hypertension, hyperlipidemia, stage IIIb CKD, hypothyroidism    PT Comments  Pt resting in bed upon PT arrival; pt reports walking in hallway this morning with staff.  During session pt modified independent with bed mobility; modified independent with transfers; and CGA to ambulate 200 feet with SPC use (occasional altered stepping pattern noted but pt able to self correct).  Pt's SpO2 sats 92% or greater on room air during sessions activities (taken on R hand); mild to moderate SOB noted with activity.  Will continue to focus on higher level balance and strengthening activities during hospitalization.   If plan is discharge home, recommend the following: A little help with bathing/dressing/bathroom;Assistance with cooking/housework;Help with stairs or ramp for entrance;Assist for transportation   Can travel by private vehicle      Yes  Equipment Recommendations  Cane;Rolling walker (2 wheels) (pt reports having SPC and RW already)    Recommendations for Other Services       Precautions / Restrictions Precautions Precautions: Fall Precaution Comments: monitor O2 Restrictions Weight Bearing Restrictions: No     Mobility  Bed Mobility Overal bed mobility: Modified Independent Bed Mobility: Supine to Sit, Sit to Supine     Supine to sit: Modified independent (Device/Increase time) Sit to supine: Modified independent (Device/Increase time)   General bed mobility comments: HOB elevated; no difficulties noted    Transfers Overall transfer level: Modified independent Equipment used: None Transfers: Sit to/from Stand Sit to Stand:  Modified independent (Device/Increase time)           General transfer comment: steady transfers from bed and from toilet    Ambulation/Gait Ambulation/Gait assistance: Contact guard assist Gait Distance (Feet): 200 Feet Assistive device: Straight cane Gait Pattern/deviations: Step-through pattern Gait velocity: decreased     General Gait Details: occasional altered stepping pattern but pt able to correct on own   Stairs             Wheelchair Mobility     Tilt Bed    Modified Rankin (Stroke Patients Only)       Balance Overall balance assessment: Needs assistance Sitting-balance support: No upper extremity supported Sitting balance-Leahy Scale: Normal Sitting balance - Comments: steady reaching outside BOS   Standing balance support: Single extremity supported, Reliant on assistive device for balance Standing balance-Leahy Scale: Good Standing balance comment: occasional altered stepping pattern ambulating with SPC but pt able to self correct                            Cognition Arousal: Alert Behavior During Therapy: WFL for tasks assessed/performed Overall Cognitive Status: Within Functional Limits for tasks assessed                                          Exercises      General Comments  Nursing cleared pt for participation in physical therapy.  Pt agreeable to PT session.      Pertinent Vitals/Pain Pain Assessment Pain Assessment: No/denies pain HR 65-83 bpm during sessions activities.    Home  Living                          Prior Function            PT Goals (current goals can now be found in the care plan section) Acute Rehab PT Goals Patient Stated Goal: go home (will be staying with daughter) PT Goal Formulation: With patient Time For Goal Achievement: 06/30/23 Potential to Achieve Goals: Good Progress towards PT goals: Progressing toward goals    Frequency    Min 1X/week       PT Plan      Co-evaluation              AM-PAC PT "6 Clicks" Mobility   Outcome Measure  Help needed turning from your back to your side while in a flat bed without using bedrails?: None Help needed moving from lying on your back to sitting on the side of a flat bed without using bedrails?: None Help needed moving to and from a bed to a chair (including a wheelchair)?: None Help needed standing up from a chair using your arms (e.g., wheelchair or bedside chair)?: None Help needed to walk in hospital room?: A Little Help needed climbing 3-5 steps with a railing? : A Little 6 Click Score: 22    End of Session Equipment Utilized During Treatment: Gait belt Activity Tolerance: Patient tolerated treatment well Patient left: in bed;with call bell/phone within reach;with bed alarm set Nurse Communication: Mobility status;Precautions PT Visit Diagnosis: Unsteadiness on feet (R26.81);Muscle weakness (generalized) (M62.81)     Time: 0981-1914 PT Time Calculation (min) (ACUTE ONLY): 15 min  Charges:    $Therapeutic Exercise: 8-22 mins PT General Charges $$ ACUTE PT VISIT: 1 Visit                     Hendricks Limes, PT 06/19/23, 3:20 PM

## 2023-06-19 NOTE — Progress Notes (Signed)
OT Cancellation Note  Patient Details Name: Jacob Glenn MRN: [id number] DOB: [birth year]   Cancelled Treatment:    Reason Eval/Treat Not Completed: Fatigue/lethargy limiting ability to participate (per PT, patient very fatigued from PT session this afternoon and requesting rest break in room with lights out. OT to hold for patient to rest.)  Alvester Morin 06/19/2023, 2:59 PM

## 2023-06-19 NOTE — TOC Transition Note (Signed)
Transition of Care [facility]) - CM/SW Discharge Note   Patient Details  Name: Jacob Glenn MRN: [id number] Date of Birth: May 22, 1932  Transition [facility] Care [facility]) CM/SW Contact:  Truddie Hidden, RN Phone Number: 06/19/2023, 4:26 PM   Clinical Narrative:    Discharging home.  Cory from [facility] notified.  Patient's daughter and son notififed of discharge home today.  His son will transport him home. He was advised Frances Furbish will contact him to scheduled patient's SOC.    TC signing off     Barriers to Discharge: Continued Medical Work up   Patient Goals and CMS Choice CMS Medicare.gov Compare Post Acute Care list provided to:: Patient Represenative (must comment) (Son) Choice offered to / list presented to : Patient, Adult Children  Discharge Placement                         Discharge Plan and Services Additional resources added to the After Visit Summary for       Post Acute Care Choice: Home Health                    HH Arranged: PT, OT [facility] Agency: [facility] Date [facility] Agency Contacted: 06/16/23   Representative spoke with at [facility] Agency: Lorenza Chick  Social Determinants of Health (SDOH) Interventions SDOH Screenings   Food Insecurity: No Food Insecurity (06/15/2023)  Housing: Low Risk  (06/15/2023)  Transportation Needs: No Transportation Needs (06/15/2023)  Utilities: Not At Risk (06/15/2023)  Financial Resource Strain: Low Risk  (06/15/2023)   Received from [facility]  Tobacco Use: Low Risk  (06/16/2023)  Recent Concern: Tobacco Use - Medium Risk (06/15/2023)   Received from [facility]     Readmission Risk Interventions     No data to display

## 2023-06-19 NOTE — TOC Progression Note (Signed)
Transition of Care [facility]) - Progression Note    Patient Details  Name: Jacob Glenn MRN: [id number] Date of Birth: Nov 21, 1931  Transition of Care [facility]) CM/SW Contact  Truddie Hidden, RN Phone Number: 06/19/2023, 2:42 PM  Clinical Narrative:    TOC continuing ongoing assessments for needs that may arise and changes in discharge plan.   Expected Discharge Plan: Home w Home Health Services Barriers to Discharge: Continued Medical Work up  Expected Discharge Plan and Services     Post Acute Care Choice: Home Health Living arrangements for the past 2 months: Single Family Home                           HH Arranged: PT, OT [facility] Agency: [facility] Date [facility] Agency Contacted: 06/16/23   Representative spoke with at [facility] Agency: Lorenza Chick   Social Determinants of Health (SDOH) Interventions SDOH Screenings   Food Insecurity: No Food Insecurity (06/15/2023)  Housing: Low Risk  (06/15/2023)  Transportation Needs: No Transportation Needs (06/15/2023)  Utilities: Not At Risk (06/15/2023)  Financial Resource Strain: Low Risk  (06/15/2023)   Received from [facility]  Tobacco Use: Low Risk  (06/16/2023)  Recent Concern: Tobacco Use - Medium Risk (06/15/2023)   Received from [facility]    Readmission Risk Interventions     No data to display

## 2023-06-19 NOTE — Plan of Care (Signed)

## 2024-07-31 DEATH — deceased
